# Patient Record
Sex: Female | Born: 1995 | Race: Black or African American | Hispanic: No | Marital: Married | Smoking: Current every day smoker
Health system: Southern US, Community
[De-identification: ages and names within clinical notes are randomized; demographics above are authoritative.]

## PROBLEM LIST (undated history)

## (undated) ENCOUNTER — Inpatient Hospital Stay (HOSPITAL_COMMUNITY): Payer: Self-pay

## (undated) DIAGNOSIS — Q613 Polycystic kidney, unspecified: Secondary | ICD-10-CM

## (undated) DIAGNOSIS — I10 Essential (primary) hypertension: Secondary | ICD-10-CM

## (undated) DIAGNOSIS — J45909 Unspecified asthma, uncomplicated: Secondary | ICD-10-CM

## (undated) HISTORY — PX: OTHER SURGICAL HISTORY: SHX169

---
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
## 2015-02-22 ENCOUNTER — Encounter (HOSPITAL_COMMUNITY): Payer: Self-pay | Admitting: *Deleted

## 2015-02-22 ENCOUNTER — Emergency Department (INDEPENDENT_AMBULATORY_CARE_PROVIDER_SITE_OTHER)
Admission: EM | Admit: 2015-02-22 | Discharge: 2015-02-22 | Disposition: A | Discharge status: Home / Self Care | Payer: Medicaid Other | Source: Home / Self Care | Attending: Family Medicine | Admitting: Family Medicine

## 2015-02-22 DIAGNOSIS — R197 Diarrhea, unspecified: Secondary | ICD-10-CM

## 2015-02-22 DIAGNOSIS — J069 Acute upper respiratory infection, unspecified: Secondary | ICD-10-CM

## 2015-02-22 DIAGNOSIS — J029 Acute pharyngitis, unspecified: Secondary | ICD-10-CM

## 2015-02-22 LAB — POCT URINALYSIS DIP (DEVICE)
Bilirubin Urine: NEGATIVE
Glucose, UA: NEGATIVE mg/dL
KETONES UR: NEGATIVE mg/dL
Leukocytes, UA: NEGATIVE
Nitrite: NEGATIVE
PROTEIN: NEGATIVE mg/dL
Urobilinogen, UA: 0.2 mg/dL (ref 0.0–1.0)
pH: 6.5 (ref 5.0–8.0)

## 2015-02-22 LAB — POCT PREGNANCY, URINE: Preg Test, Ur: NEGATIVE

## 2015-02-22 LAB — POCT RAPID STREP A: STREPTOCOCCUS, GROUP A SCREEN (DIRECT): NEGATIVE

## 2015-02-22 MED ORDER — LOPERAMIDE HCL 2 MG PO CAPS: 2.0000 mg | ORAL_CAPSULE | Freq: Four times a day (QID) | ORAL | Status: DC | PRN

## 2015-02-22 MED ORDER — IBUPROFEN 800 MG PO TABS: 800.0000 mg | ORAL_TABLET | Freq: Three times a day (TID) | ORAL | Status: DC

## 2015-02-22 NOTE — ED Provider Notes (Signed)
CSN: [id number]     Arrival date & time 02/22/15  1209 History   First MD Initiated Contact with Patient 02/22/15 1405     Chief Complaint  Patient presents with  . URI   (Consider location/radiation/quality/duration/timing/severity/associated sxs/prior Treatment) HPI    19 year old female presents complaining of sore throat, headache, subjective fever and chills, diarrhea. Her symptoms started on Monday and have been waxing and waning since that time. She feels like overall she is getting slightly better. She is taking over-the-counter medications with some temporary relief of her symptoms. She had one episode of vomiting on Monday but none since then. No chest pain or shortness of breath. She does have some mild lower abdominal pain. [place] travel out of the country. She got her flu shot this year. She has exposure to multiple other people with a similar illness  History reviewed. No pertinent past medical history. History reviewed. No pertinent past surgical history. History reviewed. No pertinent family history. History  Substance Use Topics  . Smoking status: Never Smoker   . Smokeless tobacco: Not on file  . Alcohol Use: No   OB History    No data available     Review of Systems  Constitutional: Positive for fever and chills.  HENT: Positive for congestion, rhinorrhea and sore throat.   Respiratory: Negative for cough and shortness of breath.   Cardiovascular: Negative for chest pain.  Gastrointestinal: Positive for abdominal pain and diarrhea.  All other systems reviewed and are negative.   Allergies  Review of patient's allergies indicates no known allergies.  Home Medications   Prior to Admission medications   Medication Sig Start Date End Date Taking? Authorizing Provider  clindamycin (CLEOCIN) 300 MG capsule Take 1 capsule (300 mg total) by mouth 3 (three) times daily. 07/03/14   Graylon GoodZachary H Derrico Zhong, PA-C  HYDROcodone-acetaminophen  (NORCO/VICODIN) 5-325 MG per tablet Take 1 tablet by mouth every 4 (four) hours as needed for moderate pain. 06/13/14   Burgess AmorJulie Idol, PA-C  ibuprofen (ADVIL,MOTRIN) 400 MG tablet Take 400 mg by mouth every 6 (six) hours as needed for moderate pain.    Historical Provider, MD  ibuprofen (ADVIL,MOTRIN) 800 MG tablet Take 1 tablet (800 mg total) by mouth 3 (three) times daily. 07/03/14   Graylon GoodZachary H Amilia Vandenbrink, PA-C  ibuprofen (ADVIL,MOTRIN) 800 MG tablet Take 1 tablet (800 mg total) by mouth 3 (three) times daily. 02/22/15   Graylon GoodZachary H Eston Heslin, PA-C  loperamide (IMODIUM) 2 MG capsule Take 1 capsule (2 mg total) by mouth 4 (four) times daily as needed for diarrhea or loose stools. 02/22/15   Adrian BlackwaterZachary H Kirsi Hugh, PA-C   BP 115/65 mmHg  Pulse 59  Temp(Src) 99.1 F (37.3 C) (Oral)  Resp 14  SpO2 100%  LMP 01/26/2015 Physical Exam  Constitutional: She is oriented to person, place, and time. Vital signs are normal. She appears well-developed and well-nourished. No distress.  HENT:  Head: Normocephalic and atraumatic.  Right Ear: External ear normal.  Left Ear: External ear normal.  Mouth/Throat: Posterior oropharyngeal erythema present. No oropharyngeal exudate.  Eyes: Conjunctivae are normal.  Neck: Normal range of motion. Neck supple.  Cardiovascular: Normal rate, regular rhythm and normal heart sounds.   Pulmonary/Chest: Effort normal and breath sounds normal. No respiratory distress.  Abdominal: Normal appearance and bowel sounds are normal. There is tenderness in the left lower quadrant. There is no rigidity, no rebound, no guarding and no CVA tenderness.  Lymphadenopathy:    She  has no cervical adenopathy.  Neurological: She is alert and oriented to person, place, and time. She has normal strength. Coordination normal.  Skin: Skin is warm and dry. No rash noted. She is not diaphoretic.  Psychiatric: She has a normal mood and affect. Judgment normal.  Nursing note and vitals reviewed.   ED Course   Procedures (including critical care time) Labs Review Labs Reviewed  POCT URINALYSIS DIP (DEVICE) - Abnormal; Notable for the following:    Hgb urine dipstick TRACE (*)    All other components within normal limits  POCT RAPID STREP A ([facility] CARE ONLY)  POCT PREGNANCY, URINE    Imaging Review No results found.   MDM   1. Pharyngitis   2. URI (upper respiratory infection)   3. Diarrhea    Rapid strep is negative. Minimal abdominal tenderness but the abdomen is soft without rigidity or guarding. Urinalysis is normal and urine pregnancy is negative. Most likely viral pharyngitis. Treat symptomatically with ibuprofen and Imodium. No recent antibiotic use to cause suspicion for C. difficile. Follow-up when necessary if no improvement in a few days  Meds ordered this encounter  Medications  . ibuprofen (ADVIL,MOTRIN) 800 MG tablet    Sig: Take 1 tablet (800 mg total) by mouth 3 (three) times daily.    Dispense:  30 tablet    Refill:  0  . loperamide (IMODIUM) 2 MG capsule    Sig: Take 1 capsule (2 mg total) by mouth 4 (four) times daily as needed for diarrhea or loose stools.    Dispense:  12 capsule    Refill:  0       Graylon Good, PA-C 02/22/15 1504

## 2015-02-22 NOTE — Discharge Instructions (Signed)
Food Choices to Help Relieve Diarrhea When you have diarrhea, the foods you eat and your eating habits are very important. Choosing the right foods and drinks can help relieve diarrhea. Also, because diarrhea can last up to 7 days, you need to replace lost fluids and electrolytes (such as sodium, potassium, and chloride) in order to help prevent dehydration.  WHAT GENERAL GUIDELINES DO I NEED TO FOLLOW?  Slowly drink 1 cup (8 oz) of fluid for each episode of diarrhea. If you are getting enough fluid, your urine will be clear or pale yellow.  Eat starchy foods. Some good choices include white rice, white toast, pasta, low-fiber cereal, baked potatoes (without the skin), saltine crackers, and bagels.  Avoid large servings of any cooked vegetables.  Limit fruit to two servings per day. A serving is  cup or 1 small piece.  Choose foods with less than 2 g of fiber per serving.  Limit fats to less than 8 tsp (38 g) per day.  Avoid fried foods.  Eat foods that have probiotics in them. Probiotics can be found in certain dairy products.  Avoid foods and beverages that may increase the speed at which food moves through the stomach and intestines (gastrointestinal tract). Things to avoid include:  High-fiber foods, such as dried fruit, raw fruits and vegetables, nuts, seeds, and whole grain foods.  Spicy foods and high-fat foods.  Foods and beverages sweetened with high-fructose corn syrup, honey, or sugar alcohols such as xylitol, sorbitol, and mannitol. WHAT FOODS ARE RECOMMENDED? Grains White rice. White, [place], or pita breads (fresh or toasted), including plain rolls, buns, or bagels. White pasta. Saltine, soda, or graham crackers. Pretzels. Low-fiber cereal. Cooked cereals made with water (such as cornmeal, farina, or cream cereals). Plain muffins. Matzo. Melba toast. Zwieback.  Vegetables Potatoes (without the skin). Strained tomato and vegetable juices. Most well-cooked and canned  vegetables without seeds. Tender lettuce. Fruits Cooked or canned applesauce, apricots, cherries, fruit cocktail, grapefruit, peaches, pears, or plums. Fresh bananas, apples without skin, cherries, grapes, cantaloupe, grapefruit, peaches, oranges, or plums.  Meat and Other Protein Products Baked or boiled chicken. Eggs. Tofu. Fish. Seafood. Smooth peanut butter. Ground or well-cooked tender beef, ham, veal, lamb, pork, or poultry.  Dairy Plain yogurt, kefir, and unsweetened liquid yogurt. Lactose-free milk, buttermilk, or soy milk. Plain hard cheese. Beverages Sport drinks. Clear broths. Diluted fruit juices (except prune). Regular, caffeine-free sodas such as ginger ale. Water. Decaffeinated teas. Oral rehydration solutions. Sugar-free beverages not sweetened with sugar alcohols. Other Bouillon, broth, or soups made from recommended foods.  The items listed above may not be a complete list of recommended foods or beverages. Contact your dietitian for more options. WHAT FOODS ARE NOT RECOMMENDED? Grains Whole grain, whole wheat, bran, or rye breads, rolls, pastas, crackers, and cereals. Wild or brown rice. Cereals that contain more than 2 g of fiber per serving. Corn tortillas or taco shells. Cooked or dry oatmeal. Granola. Popcorn. Vegetables Raw vegetables. Cabbage, broccoli, Brussels sprouts, artichokes, baked beans, beet greens, corn, kale, legumes, peas, sweet potatoes, and yams. Potato skins. Cooked spinach and cabbage. Fruits Dried fruit, including raisins and dates. Raw fruits. Stewed or dried prunes. Fresh apples with skin, apricots, mangoes, pears, raspberries, and strawberries.  Meat and Other Protein Products Chunky peanut butter. Nuts and seeds. Beans and lentils. Tomasa BlaseBacon.  Dairy High-fat cheeses. Milk, chocolate milk, and beverages made with milk, such as milk shakes. Cream. Ice cream. Sweets and Desserts Sweet rolls, doughnuts, and sweet breads. Pancakes  and waffles. Fats and  Oils Butter. Cream sauces. Margarine. Salad oils. Plain salad dressings. Olives. Avocados.  Beverages Caffeinated beverages (such as coffee, tea, soda, or energy drinks). Alcoholic beverages. Fruit juices with pulp. Prune juice. Soft drinks sweetened with high-fructose corn syrup or sugar alcohols. Other Coconut. Hot sauce. Chili powder. Mayonnaise. Gravy. Cream-based or milk-based soups.  The items listed above may not be a complete list of foods and beverages to avoid. Contact your dietitian for more information. WHAT SHOULD I DO IF I BECOME DEHYDRATED? Diarrhea can sometimes lead to dehydration. Signs of dehydration include dark urine and dry mouth and skin. If you think you are dehydrated, you should rehydrate with an oral rehydration solution. These solutions can be purchased at pharmacies, retail stores, or online.  Drink -1 cup (120-240 mL) of oral rehydration solution each time you have an episode of diarrhea. If drinking this amount makes your diarrhea worse, try drinking smaller amounts more often. For example, drink 1-3 tsp (5-15 mL) every 5-10 minutes.  A general rule for staying hydrated is to drink 1-2 L of fluid per day. Talk to your health care provider about the specific amount you should be drinking each day. Drink enough fluids to keep your urine clear or pale yellow. Document Released: 02/13/2004 Document Revised: 11/28/2013 Document Reviewed: 10/16/2013 [facility] Patient Information 2015 [place], [place]. This information is not intended to replace advice given to you by your health care provider. Make sure you discuss any questions you have with your health care provider.  Pharyngitis Pharyngitis is redness, pain, and swelling (inflammation) of your pharynx.  CAUSES  Pharyngitis is usually caused by infection. Most of the time, these infections are from viruses (viral) and are part of a cold. However, sometimes pharyngitis is caused by bacteria (bacterial). Pharyngitis can also  be caused by allergies. Viral pharyngitis may be spread from person to person by coughing, sneezing, and personal items or utensils (cups, forks, spoons, toothbrushes). Bacterial pharyngitis may be spread from person to person by more intimate contact, such as kissing.  SIGNS AND SYMPTOMS  Symptoms of pharyngitis include:   Sore throat.   Tiredness (fatigue).   Low-grade fever.   Headache.  Joint pain and muscle aches.  Skin rashes.  Swollen lymph nodes.  Plaque-like film on throat or tonsils (often seen with bacterial pharyngitis). DIAGNOSIS  Your health care provider will ask you questions about your illness and your symptoms. Your medical history, along with a physical exam, is often all that is needed to diagnose pharyngitis. Sometimes, a rapid strep test is done. Other lab tests may also be done, depending on the suspected cause.  TREATMENT  Viral pharyngitis will usually get better in 3-4 days without the use of medicine. Bacterial pharyngitis is treated with medicines that kill germs (antibiotics).  HOME CARE INSTRUCTIONS   Drink enough water and fluids to keep your urine clear or pale yellow.   Only take over-the-counter or prescription medicines as directed by your health care provider:   If you are prescribed antibiotics, make sure you finish them even if you start to feel better.   Do not take aspirin.   Get lots of rest.   Gargle with 8 oz of salt water ( tsp of salt per 1 qt of water) as often as every 1-2 hours to soothe your throat.   Throat lozenges (if you are not at risk for choking) or sprays may be used to soothe your throat. SEEK MEDICAL CARE IF:   You  have large, tender lumps in your neck.  You have a rash.  You cough up green, yellow-brown, or bloody spit. SEEK IMMEDIATE MEDICAL CARE IF:   Your neck becomes stiff.  You drool or are unable to swallow liquids.  You vomit or are unable to keep medicines or liquids down.  You have  severe pain that does not go away with the use of recommended medicines.  You have trouble breathing (not caused by a stuffy nose). MAKE SURE YOU:   Understand these instructions.  Will watch your condition.  Will get help right away if you are not doing well or get worse. Document Released: 11/23/2005 Document Revised: 09/13/2013 Document Reviewed: 07/31/2013 [facility] Patient Information 2015 [place], [place]. This information is not intended to replace advice given to you by your health care provider. Make sure you discuss any questions you have with your health care provider.

## 2015-02-22 NOTE — ED Notes (Signed)
Pt  Reports         sorethroat               Headache    Sweating             Slight  Cough         Slight      Diarrhea

## 2015-02-26 LAB — CULTURE, GROUP A STREP: STREP A CULTURE: NEGATIVE

## 2015-04-05 ENCOUNTER — Emergency Department (HOSPITAL_COMMUNITY)
Admission: EM | Admit: 2015-04-05 | Discharge: 2015-04-05 | Disposition: A | Discharge status: Home / Self Care | Payer: Medicaid Other | Attending: Emergency Medicine | Admitting: Emergency Medicine

## 2015-04-05 ENCOUNTER — Encounter (HOSPITAL_COMMUNITY): Payer: Self-pay

## 2015-04-05 DIAGNOSIS — R197 Diarrhea, unspecified: Secondary | ICD-10-CM | POA: Insufficient documentation

## 2015-04-05 DIAGNOSIS — Z79899 Other long term (current) drug therapy: Secondary | ICD-10-CM | POA: Diagnosis not present

## 2015-04-05 DIAGNOSIS — Z792 Long term (current) use of antibiotics: Secondary | ICD-10-CM | POA: Diagnosis not present

## 2015-04-05 DIAGNOSIS — Z791 Long term (current) use of non-steroidal anti-inflammatories (NSAID): Secondary | ICD-10-CM | POA: Diagnosis not present

## 2015-04-05 DIAGNOSIS — R61 Generalized hyperhidrosis: Secondary | ICD-10-CM | POA: Diagnosis not present

## 2015-04-05 DIAGNOSIS — J069 Acute upper respiratory infection, unspecified: Secondary | ICD-10-CM | POA: Insufficient documentation

## 2015-04-05 DIAGNOSIS — R05 Cough: Secondary | ICD-10-CM | POA: Diagnosis present

## 2015-04-05 MED ORDER — IBUPROFEN 800 MG PO TABS: 800.0000 mg | ORAL_TABLET | Freq: Three times a day (TID) | ORAL | Status: DC | PRN

## 2015-04-05 MED ORDER — OXYMETAZOLINE HCL 0.05 % NA SOLN: 1.0000 | Freq: Two times a day (BID) | NASAL | Status: DC

## 2015-04-05 MED ORDER — ALBUTEROL SULFATE HFA 108 (90 BASE) MCG/ACT IN AERS
2.0000 | INHALATION_SPRAY | Freq: Once | RESPIRATORY_TRACT | Status: AC
  Administered 2015-04-05: 2 via RESPIRATORY_TRACT
  Filled 2015-04-05: qty 6.7

## 2015-04-05 MED ORDER — BENZONATATE 100 MG PO CAPS: 100.0000 mg | ORAL_CAPSULE | Freq: Three times a day (TID) | ORAL | Status: DC | PRN

## 2015-04-05 NOTE — ED Notes (Signed)
Chest pain with cough, sob for over 1 week. Periods of sweating. Pt. Having nausea and vomited X1.  Skin is warm and Dry. GCS 15

## 2015-04-05 NOTE — Discharge Instructions (Signed)
Read the information below.  Use the prescribed medication as directed.  Please discuss all new medications with your pharmacist.  You may return to the Emergency Department at any time for worsening condition or any new symptoms that concern you.    If there is any possibility that you might be pregnant, please let your health care provider know and discuss this with the pharmacist to ensure medication safety.  If you develop high fevers that do not resolve with tylenol or ibuprofen, you have difficulty swallowing or breathing, or you are unable to tolerate fluids by mouth, return to the ER for a recheck.      Upper Respiratory Infection, Adult An upper respiratory infection (URI) is also known as the common cold. It is often caused by a type of germ (virus). Colds are easily spread (contagious). You can pass it to others by kissing, coughing, sneezing, or drinking out of the same glass. Usually, you get better in 1 or 2 weeks.  HOME CARE   Only take medicine as told by your doctor.  Use a warm mist humidifier or breathe in steam from a hot shower.  Drink enough water and fluids to keep your pee (urine) clear or pale yellow.  Get plenty of rest.  Return to work when your temperature is back to normal or as told by your doctor. You may use a face mask and wash your hands to stop your cold from spreading. GET HELP RIGHT AWAY IF:   After the first few days, you feel you are getting worse.  You have questions about your medicine.  You have chills, shortness of breath, or brown or red spit (mucus).  You have yellow or brown snot (nasal discharge) or pain in the face, especially when you bend forward.  You have a fever, puffy (swollen) neck, pain when you swallow, or white spots in the back of your throat.  You have a bad headache, ear pain, sinus pain, or chest pain.  You have a high-pitched whistling sound when you breathe in and out (wheezing).  You have a lasting cough or cough up  blood.  You have sore muscles or a stiff neck. MAKE SURE YOU:   Understand these instructions.  Will watch your condition.  Will get help right away if you are not doing well or get worse. Document Released: 05/11/2008 Document Revised: 02/15/2012 Document Reviewed: 02/28/2014 [facility] Patient Information 2015 [place], [place]. This information is not intended to replace advice given to you by your health care provider. Make sure you discuss any questions you have with your health care provider.

## 2015-04-05 NOTE — ED Provider Notes (Signed)
CSN: [id number]     Arrival date & time 04/05/15  1616 History  This chart was scribed for non-physician practitioner, Trixie DredgeEmily Loda Bialas, PA-C, working with Geoffery Lyonsouglas Delo, MD, by Bronson CurbJacqueline Melvin, ED Scribe. This patient was seen in room TR07C/TR07C and the patient's care was started at 4:57 PM.   Chief Complaint  Patient presents with  . Cough    The history is provided by the patient. No language interpreter was used.     HPI Comments: Samantha Rojas is a 19 y.o. female, with no significant past medical history, who presents to the Emergency Department complaining of intermittent cough for the past week. There is associated congestion and intermittent sweating at night. She reports nasal congestion and notes mild nose bleeds from a cut in her nose last week. She also mentions diarrhea, stating she has been having more frequent BMs since onset of symptoms. Patient states she is unable to breathe through nose while sleeping, but notes relief upon application of Vick's vapor rub.  She denies pain in her chest, notes "rattling" in her chest with breathing. She denies nausea, vomiting, fever, chills, sore throat, itchy/watery eyes. She mentions mild abdominal cramping, but states this is related to her menstrual period. Patient has not received the flu vaccination this season.   History reviewed. No pertinent past medical history. History reviewed. No pertinent past surgical history. No family history on file. History  Substance Use Topics  . Smoking status: Never Smoker   . Smokeless tobacco: Not on file  . Alcohol Use: No   OB History    No data available     Review of Systems  Constitutional: Positive for diaphoresis (night sweats). Negative for fever and chills.  HENT: Positive for congestion. Negative for sore throat.   Eyes: Negative for discharge and itching.  Respiratory: Positive for cough and shortness of breath.   Gastrointestinal: Positive for diarrhea. Negative for nausea and  vomiting.  Allergic/Immunologic: Positive for environmental allergies. Negative for immunocompromised state.  All other systems reviewed and are negative.     Allergies  Review of patient's allergies indicates no known allergies.  Home Medications   Prior to Admission medications   Medication Sig Start Date End Date Taking? Authorizing Provider  clindamycin (CLEOCIN) 300 MG capsule Take 1 capsule (300 mg total) by mouth 3 (three) times daily. 07/03/14   Graylon GoodZachary H Baker, PA-C  HYDROcodone-acetaminophen (NORCO/VICODIN) 5-325 MG per tablet Take 1 tablet by mouth every 4 (four) hours as needed for moderate pain. 06/13/14   Burgess AmorJulie Idol, PA-C  ibuprofen (ADVIL,MOTRIN) 400 MG tablet Take 400 mg by mouth every 6 (six) hours as needed for moderate pain.    Historical Provider, MD  ibuprofen (ADVIL,MOTRIN) 800 MG tablet Take 1 tablet (800 mg total) by mouth 3 (three) times daily. 07/03/14   Graylon GoodZachary H Baker, PA-C  ibuprofen (ADVIL,MOTRIN) 800 MG tablet Take 1 tablet (800 mg total) by mouth 3 (three) times daily. 02/22/15   Graylon GoodZachary H Baker, PA-C  loperamide (IMODIUM) 2 MG capsule Take 1 capsule (2 mg total) by mouth 4 (four) times daily as needed for diarrhea or loose stools. 02/22/15   Graylon GoodZachary H Baker, PA-C   Triage Vitals: BP 119/75 mmHg  Pulse 61  Temp(Src) 98 F (36.7 C)  Resp 18  Ht 5' 9.75" (1.772 m)  Wt 142 lb 7 oz (64.609 kg)  BMI 20.58 kg/m2  SpO2 100%  LMP 04/04/2015  Physical Exam  Constitutional: She appears well-developed and well-nourished. No distress.  HENT:  Head: Normocephalic and atraumatic.  Mouth/Throat: Posterior oropharyngeal erythema present. No oropharyngeal exudate or posterior oropharyngeal edema.  Erythema in throat without edema or exudate.  Nasal turbinates are edematous and erythematous.  Eyes: Conjunctivae are normal.  Neck: Neck supple.  Cardiovascular: Normal rate and regular rhythm.   Pulmonary/Chest: Effort normal and breath sounds normal. No respiratory  distress. She has no wheezes. She has no rales.  Lungs are clear.  Neurological: She is alert.  Skin: She is not diaphoretic.  Nursing note and vitals reviewed.   ED Course  Procedures (including critical care time)  DIAGNOSTIC STUDIES: Oxygen Saturation is 100% on room air, normal by my interpretation.    COORDINATION OF CARE: At 1706 Discussed treatment plan with patient which includes albuterol inhaler. Patient agrees.   Labs Review Labs Reviewed - No data to display  Imaging Review No results found.   EKG Interpretation None        Date: 04/05/2015  Rate: 60 Rhythm: normal sinus rhythm  QRS Axis: normal  Intervals: normal  ST/T Wave abnormalities: normal  Conduction Disutrbances: none  Narrative Interpretation:   Old EKG Reviewed: unavailable     MDM   Final diagnoses:  URI (upper respiratory infection)    Afebrile, nontoxic patient with constellation of symptoms suggestive of viral syndrome.  No concerning findings on exam.  Discharged home with supportive care, PCP follow up.  Discussed result, findings, treatment, and follow up  with patient.  Pt given return precautions.  Pt verbalizes understanding and agrees with plan.      I personally performed the services described in this documentation, which was scribed in my presence. The recorded information has been reviewed and is accurate.    Trixie Dredge, PA-C 04/05/15 1931  Geoffery Lyons, MD 04/05/15 2013

## 2015-05-17 ENCOUNTER — Emergency Department (HOSPITAL_COMMUNITY): Payer: Medicaid Other

## 2015-05-17 ENCOUNTER — Encounter (HOSPITAL_COMMUNITY): Payer: Self-pay | Admitting: Emergency Medicine

## 2015-05-17 ENCOUNTER — Emergency Department (HOSPITAL_COMMUNITY)
Admission: EM | Admit: 2015-05-17 | Discharge: 2015-05-17 | Disposition: A | Discharge status: Home / Self Care | Payer: Medicaid Other | Attending: Emergency Medicine | Admitting: Emergency Medicine

## 2015-05-17 DIAGNOSIS — Y9389 Activity, other specified: Secondary | ICD-10-CM | POA: Diagnosis not present

## 2015-05-17 DIAGNOSIS — S299XXA Unspecified injury of thorax, initial encounter: Secondary | ICD-10-CM | POA: Diagnosis not present

## 2015-05-17 DIAGNOSIS — Y998 Other external cause status: Secondary | ICD-10-CM | POA: Insufficient documentation

## 2015-05-17 DIAGNOSIS — S0990XA Unspecified injury of head, initial encounter: Secondary | ICD-10-CM | POA: Diagnosis present

## 2015-05-17 DIAGNOSIS — J45909 Unspecified asthma, uncomplicated: Secondary | ICD-10-CM | POA: Diagnosis not present

## 2015-05-17 DIAGNOSIS — S3992XA Unspecified injury of lower back, initial encounter: Secondary | ICD-10-CM

## 2015-05-17 DIAGNOSIS — Y9289 Other specified places as the place of occurrence of the external cause: Secondary | ICD-10-CM | POA: Insufficient documentation

## 2015-05-17 DIAGNOSIS — W19XXXA Unspecified fall, initial encounter: Secondary | ICD-10-CM

## 2015-05-17 DIAGNOSIS — Z79899 Other long term (current) drug therapy: Secondary | ICD-10-CM | POA: Diagnosis not present

## 2015-05-17 HISTORY — DX: Unspecified asthma, uncomplicated: J45.909

## 2015-05-17 MED ORDER — ACETAMINOPHEN 500 MG PO TABS
1000.0000 mg | ORAL_TABLET | Freq: Once | ORAL | Status: AC
  Administered 2015-05-17: 1000 mg via ORAL
  Filled 2015-05-17: qty 2

## 2015-05-17 MED ORDER — IBUPROFEN 800 MG PO TABS: 800.0000 mg | ORAL_TABLET | Freq: Three times a day (TID) | ORAL | Status: DC

## 2015-05-17 MED ORDER — IBUPROFEN 800 MG PO TABS
800.0000 mg | ORAL_TABLET | Freq: Once | ORAL | Status: AC
  Administered 2015-05-17: 800 mg via ORAL
  Filled 2015-05-17: qty 1

## 2015-05-17 NOTE — ED Notes (Signed)
Patient transported to X-ray 
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
## 2015-05-17 NOTE — Discharge Instructions (Signed)
Return to the emergency room with worsening of symptoms, new symptoms or with symptoms that are concerning , especially worsening back pain, loss of control of bladder or bowel, numbness, tingling weakness OR severe worsening of headache, visual or speech changes, weakness in face, arms or legs. Follow up with wellness center in one week. RICE: Rest, Ice (three cycles of 20 mins on, off at least twice a day), compression/brace, elevation. Heating pad works well for back pain. Ibuprofen for pain. Read below information and follow recommendations.  Contusion A contusion is a deep bruise. Contusions are the result of an injury that caused bleeding under the skin. The contusion may turn blue, purple, or yellow. Minor injuries will give you a painless contusion, but more severe contusions may stay painful and swollen for a few weeks.  CAUSES  A contusion is usually caused by a blow, trauma, or direct force to an area of the body. SYMPTOMS   Swelling and redness of the injured area.  Bruising of the injured area.  Tenderness and soreness of the injured area.  Pain. DIAGNOSIS  The diagnosis can be made by taking a history and physical exam. An X-ray, CT scan, or MRI may be needed to determine if there were any associated injuries, such as fractures. TREATMENT  Specific treatment will depend on what area of the body was injured. In general, the best treatment for a contusion is resting, icing, elevating, and applying cold compresses to the injured area. Over-the-counter medicines may also be recommended for pain control. Ask your caregiver what the best treatment is for your contusion. HOME CARE INSTRUCTIONS   Put ice on the injured area.  Put ice in a plastic bag.  Place a towel between your skin and the bag.  Leave the ice on for 15-20 minutes, 3-4 times a day, or as directed by your health care provider.  Only take over-the-counter or prescription medicines for pain, discomfort, or  fever as directed by your caregiver. Your caregiver may recommend avoiding anti-inflammatory medicines (aspirin, ibuprofen, and naproxen) for 48 hours because these medicines may increase bruising.  Rest the injured area.  If possible, elevate the injured area to reduce swelling. SEEK IMMEDIATE MEDICAL CARE IF:   You have increased bruising or swelling.  You have pain that is getting worse.  Your swelling or pain is not relieved with medicines. MAKE SURE YOU:   Understand these instructions.  Will watch your condition.  Will get help right away if you are not doing well or get worse. Document Released: 09/02/2005 Document Revised: 11/28/2013 Document Reviewed: 09/28/2011 [facility] Patient Information 2015 [place], [place]. This information is not intended to replace advice given to you by your health care provider. Make sure you discuss any questions you have with your health care provider.
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
## 2015-09-10 IMAGING — CR DG LUMBAR SPINE COMPLETE 4+V
5 series · 5 of 5 positions shown · non-contrast
Comparison: 04/01/2013

CLINICAL DATA: Jumped from second floor window with back pain

EXAM:
LUMBAR SPINE - COMPLETE 4+ VIEW

[l-spine ap]
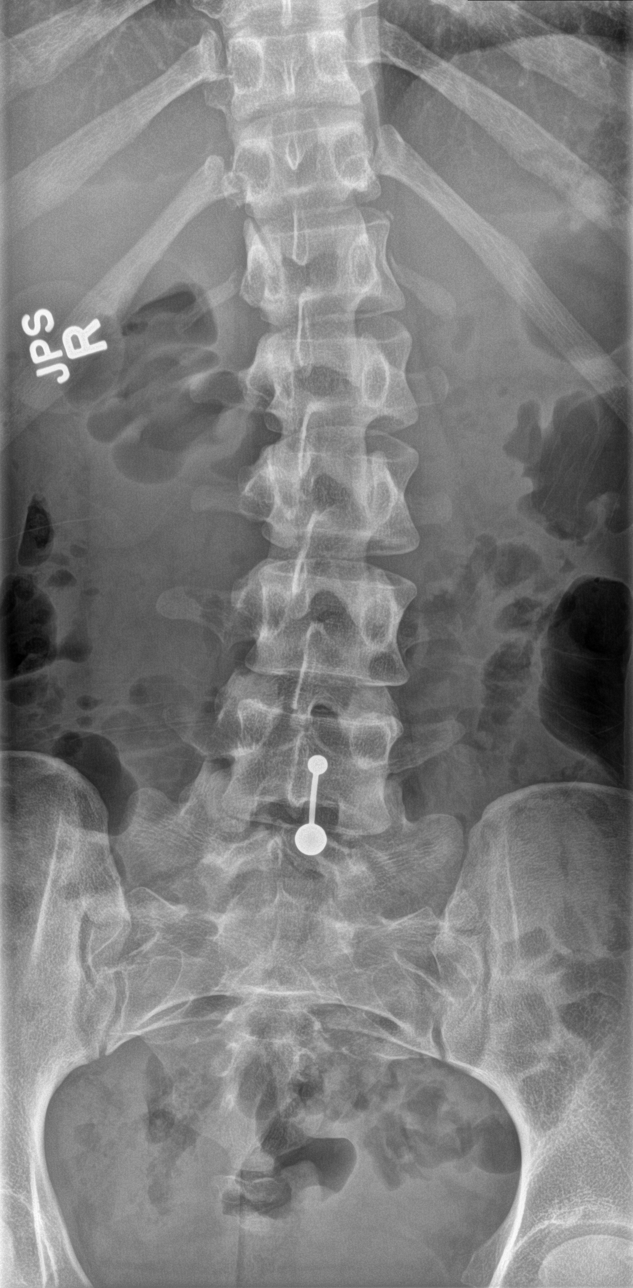

[l-spine obl (1 of 2)]
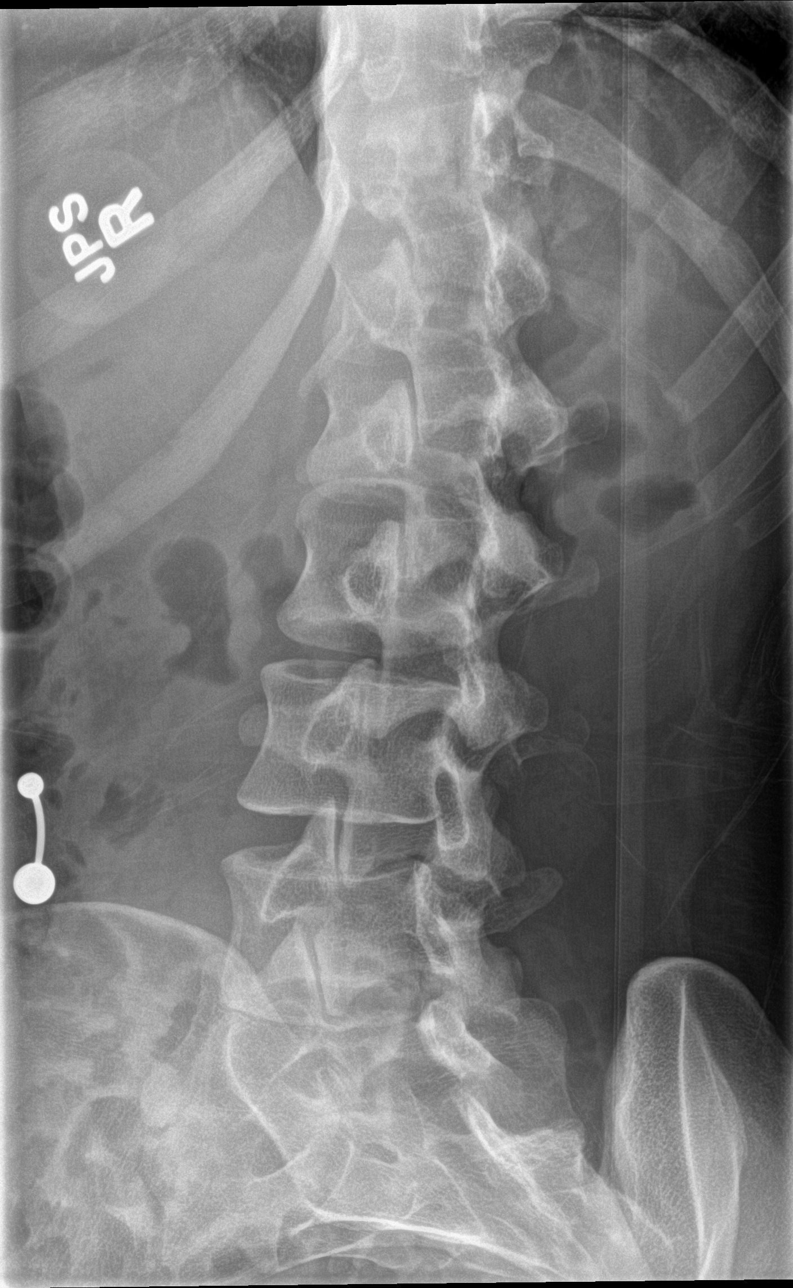

[l-spine obl (2 of 2)]
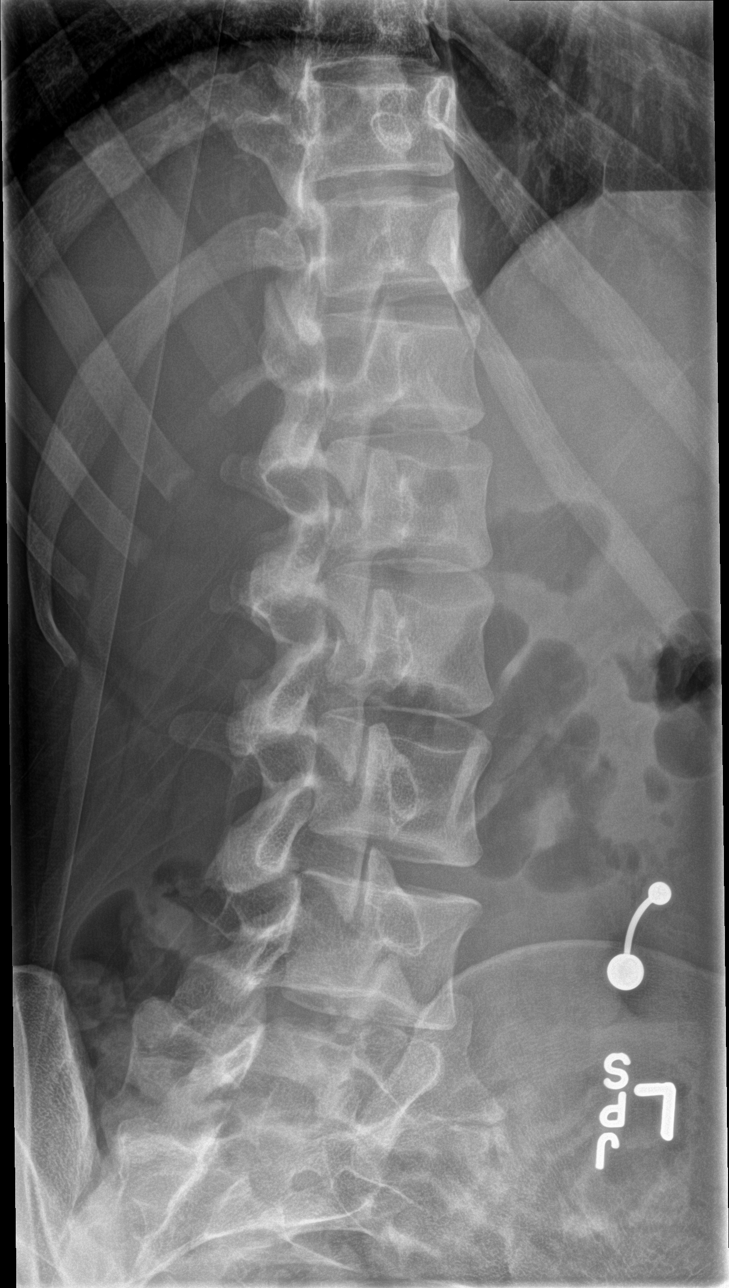

[l-spine lat]
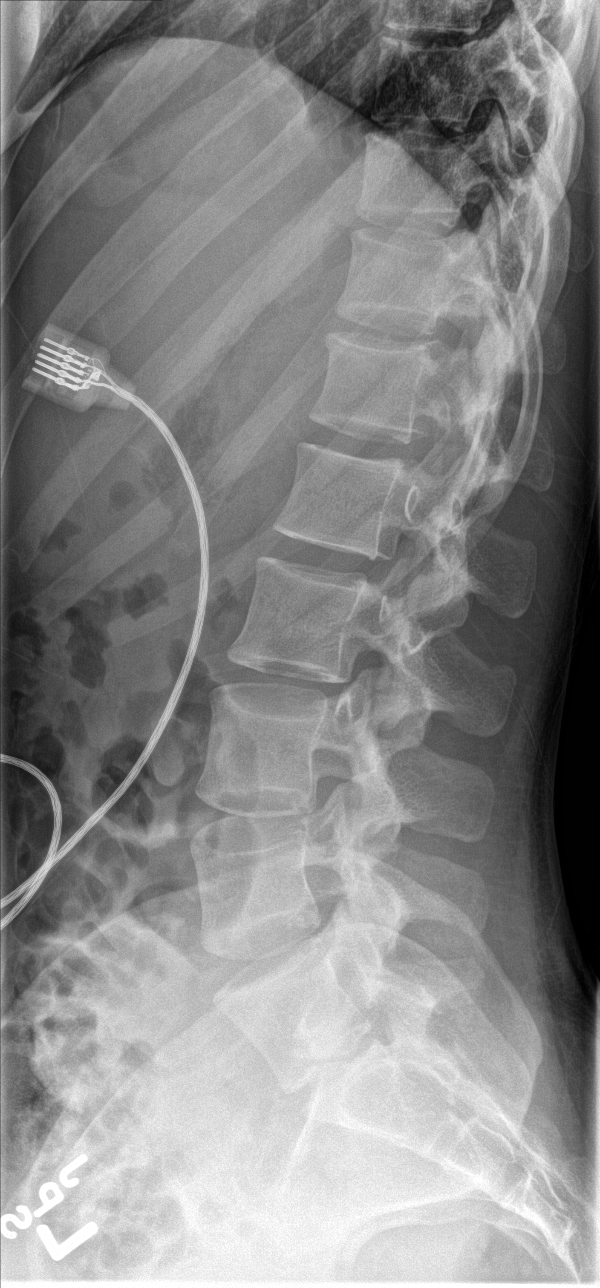

[l-spine spot]
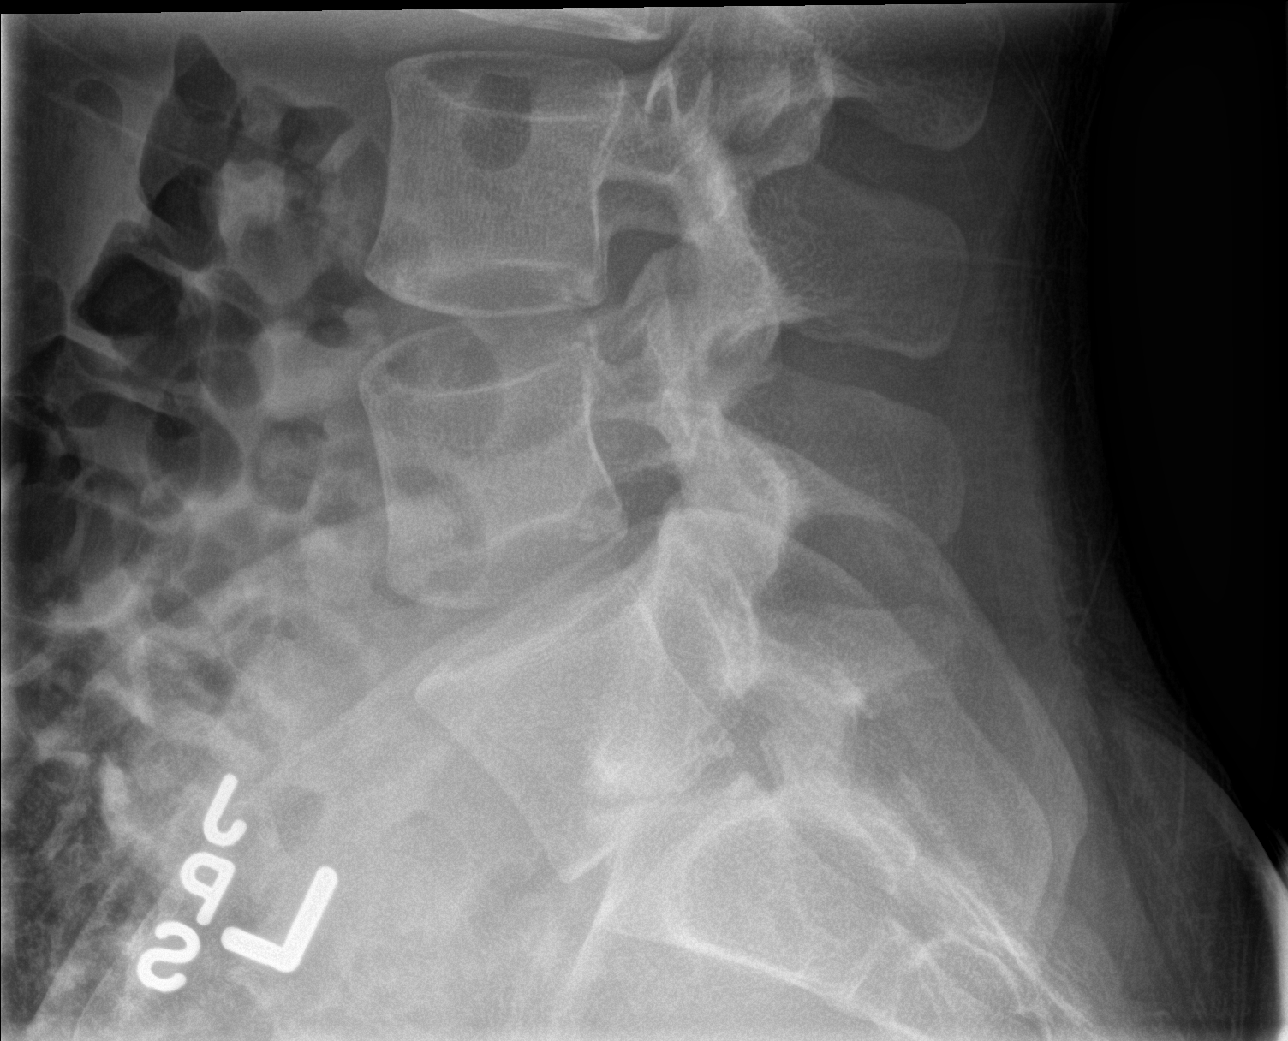

[5 of 5 positions shown; findings below may reference images not displayed]

FINDINGS: Vertebral body height is well maintained. The fifth lumbar vertebra
is partially sacralized. No pars defects are seen. No
anterolisthesis is noted. No acute abnormality is seen. Stable
scoliosis from the prior exam is noted.
IMPRESSION: Stable scoliosis.  No acute abnormality is noted.

## 2015-09-10 IMAGING — CR DG CHEST 2V
2 series · 2 of 2 positions shown · non-contrast
Comparison: None.

CLINICAL DATA: Jumped from second floor window with chest pain

EXAM:
CHEST - 2 VIEW

[chest pa]
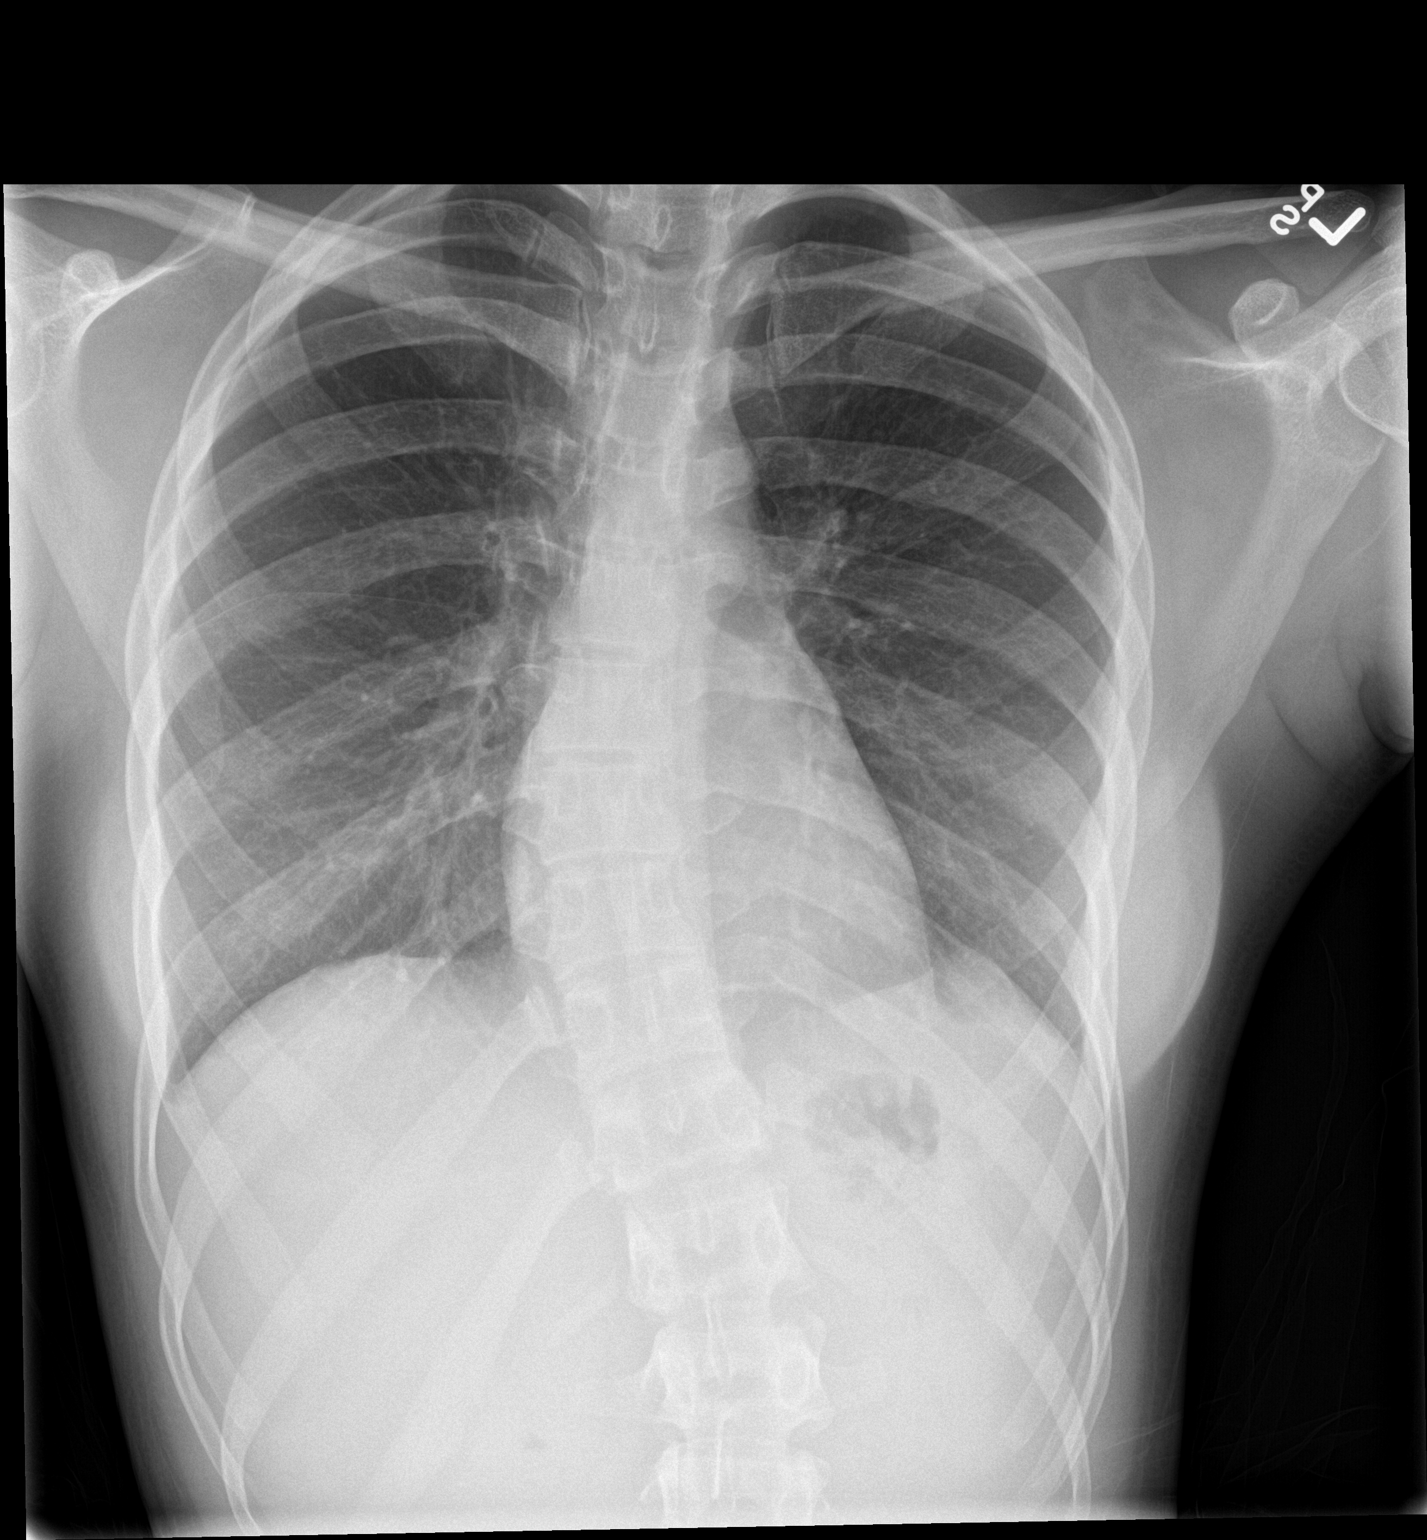

[chest lat]
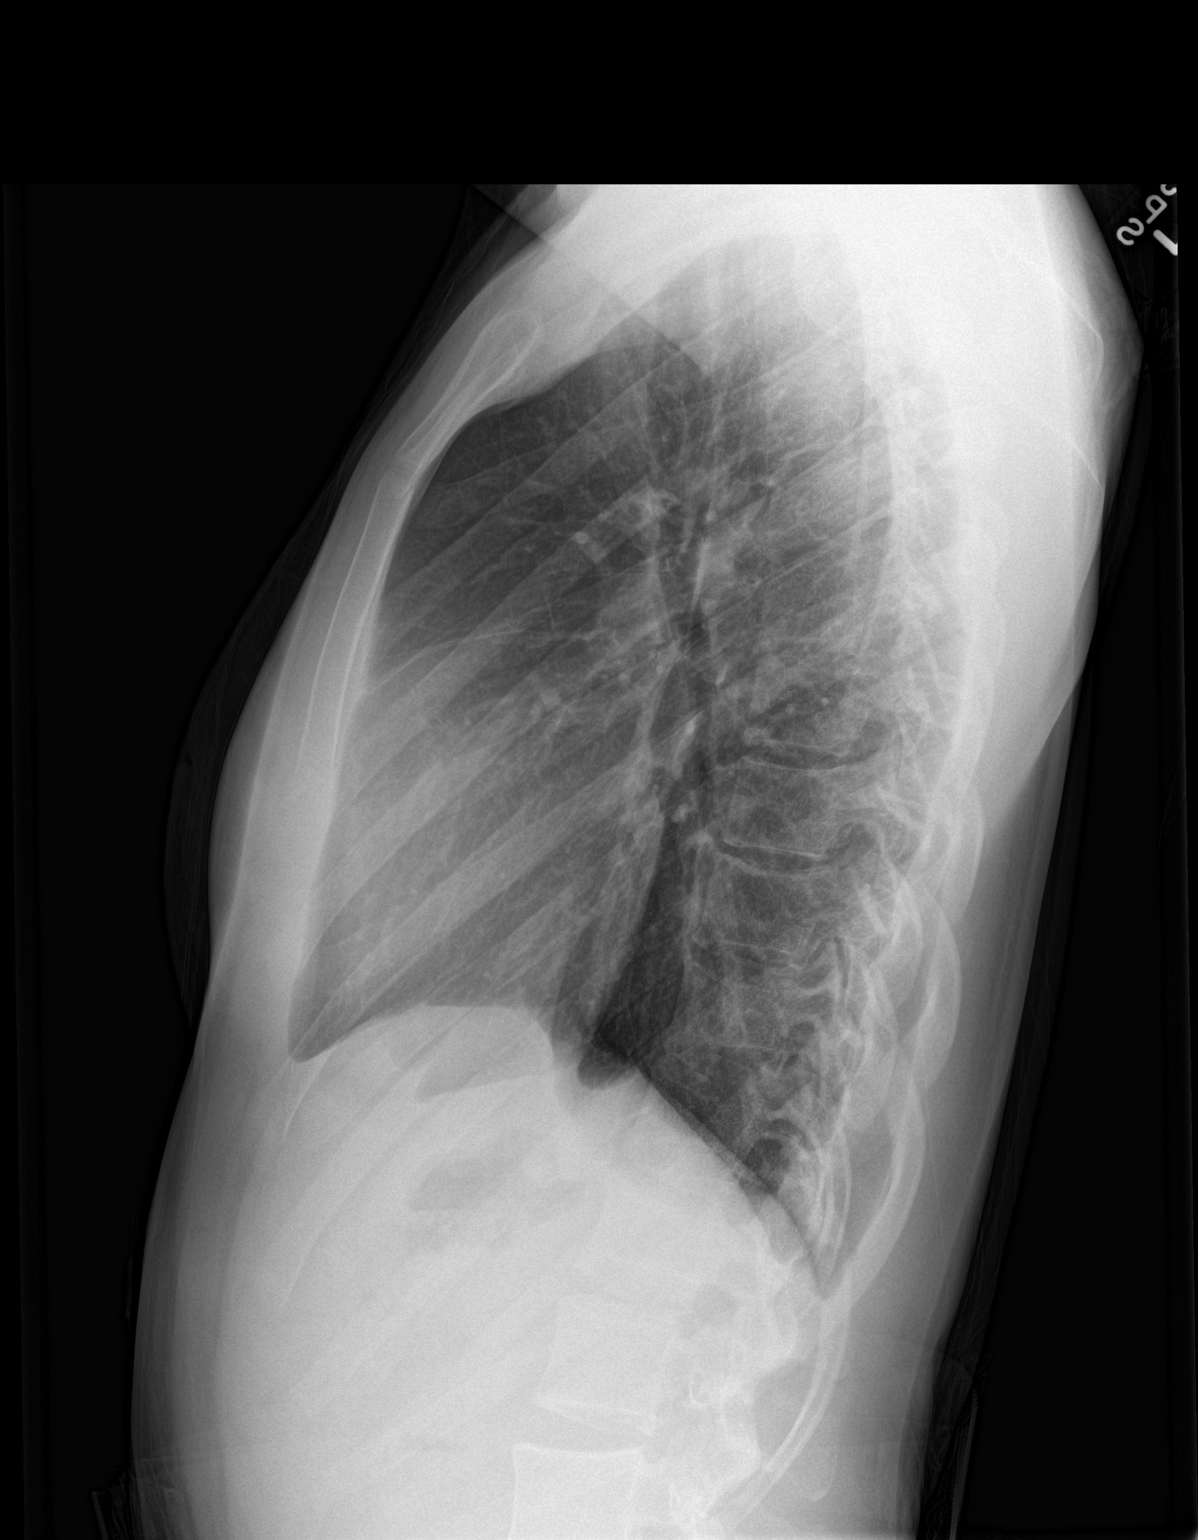

[2 of 2 positions shown; findings below may reference images not displayed]

FINDINGS: The heart size and mediastinal contours are within normal limits.
Both lungs are clear. The visualized skeletal structures show a
scoliosis of the thoracolumbar spine.
IMPRESSION: No active disease.

## 2015-09-10 IMAGING — CR DG THORACIC SPINE 2V
3 series · 3 of 3 positions shown · non-contrast
Comparison: None.

CLINICAL DATA: Jumped from second floor window with back pain

EXAM:
THORACIC SPINE - 2 VIEW

[t-spine ap]
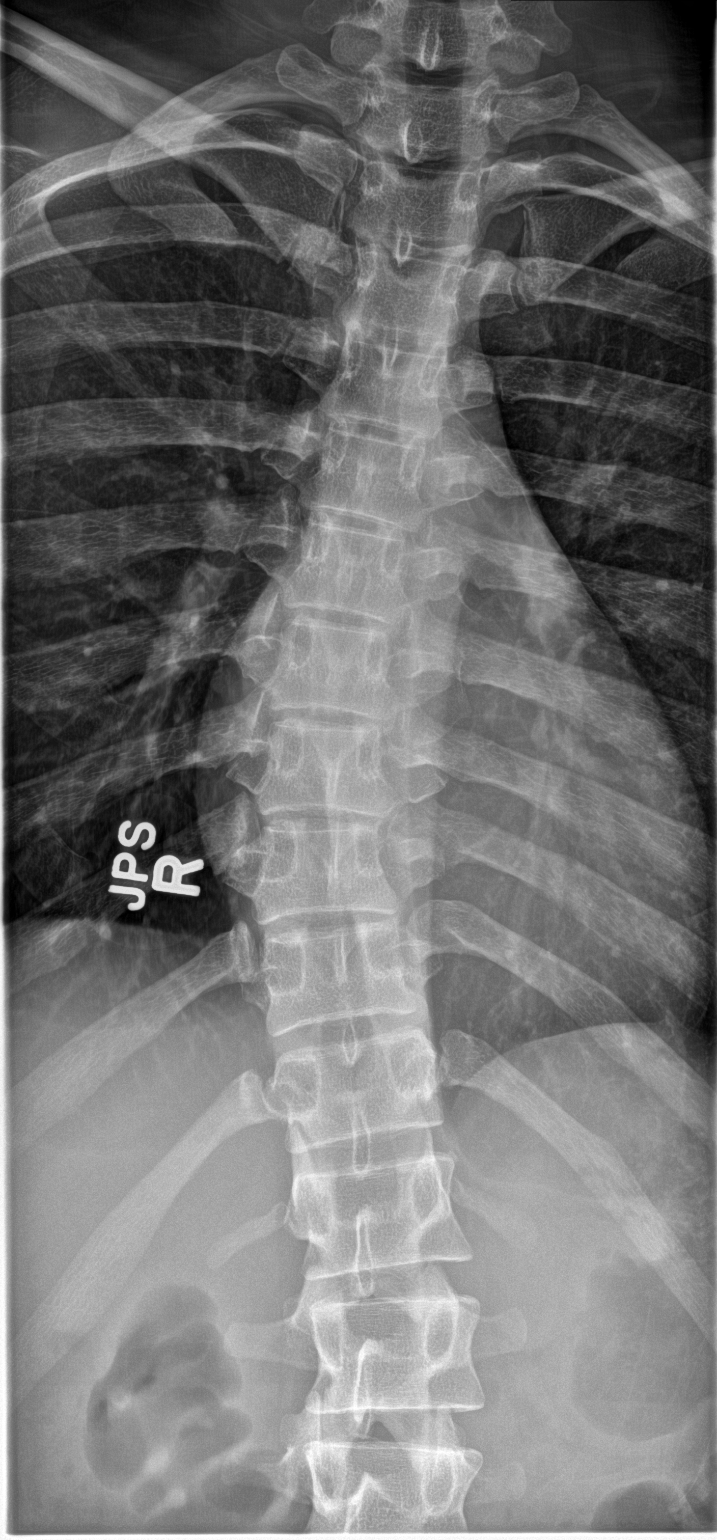

[t-spine lat]
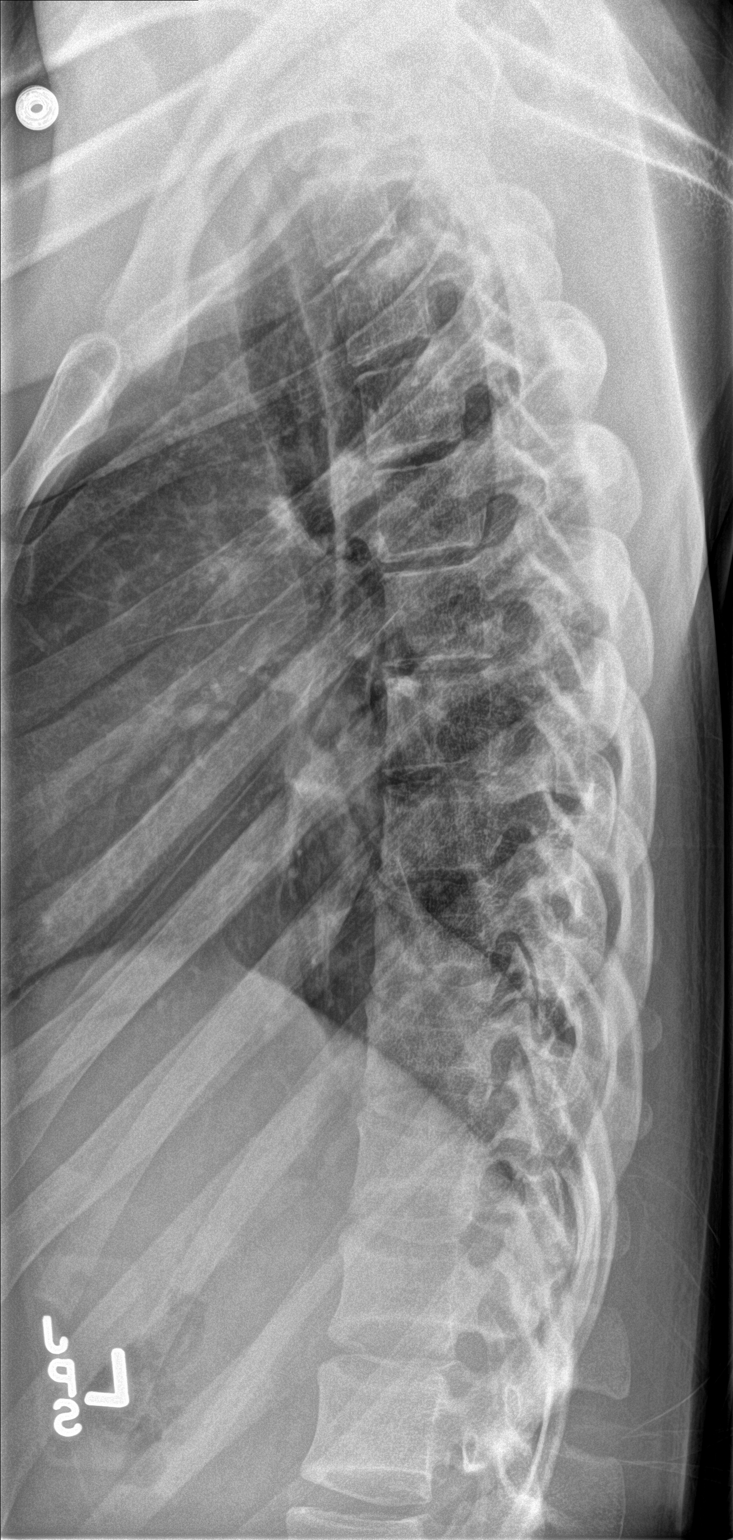

[t-spine swimmers]
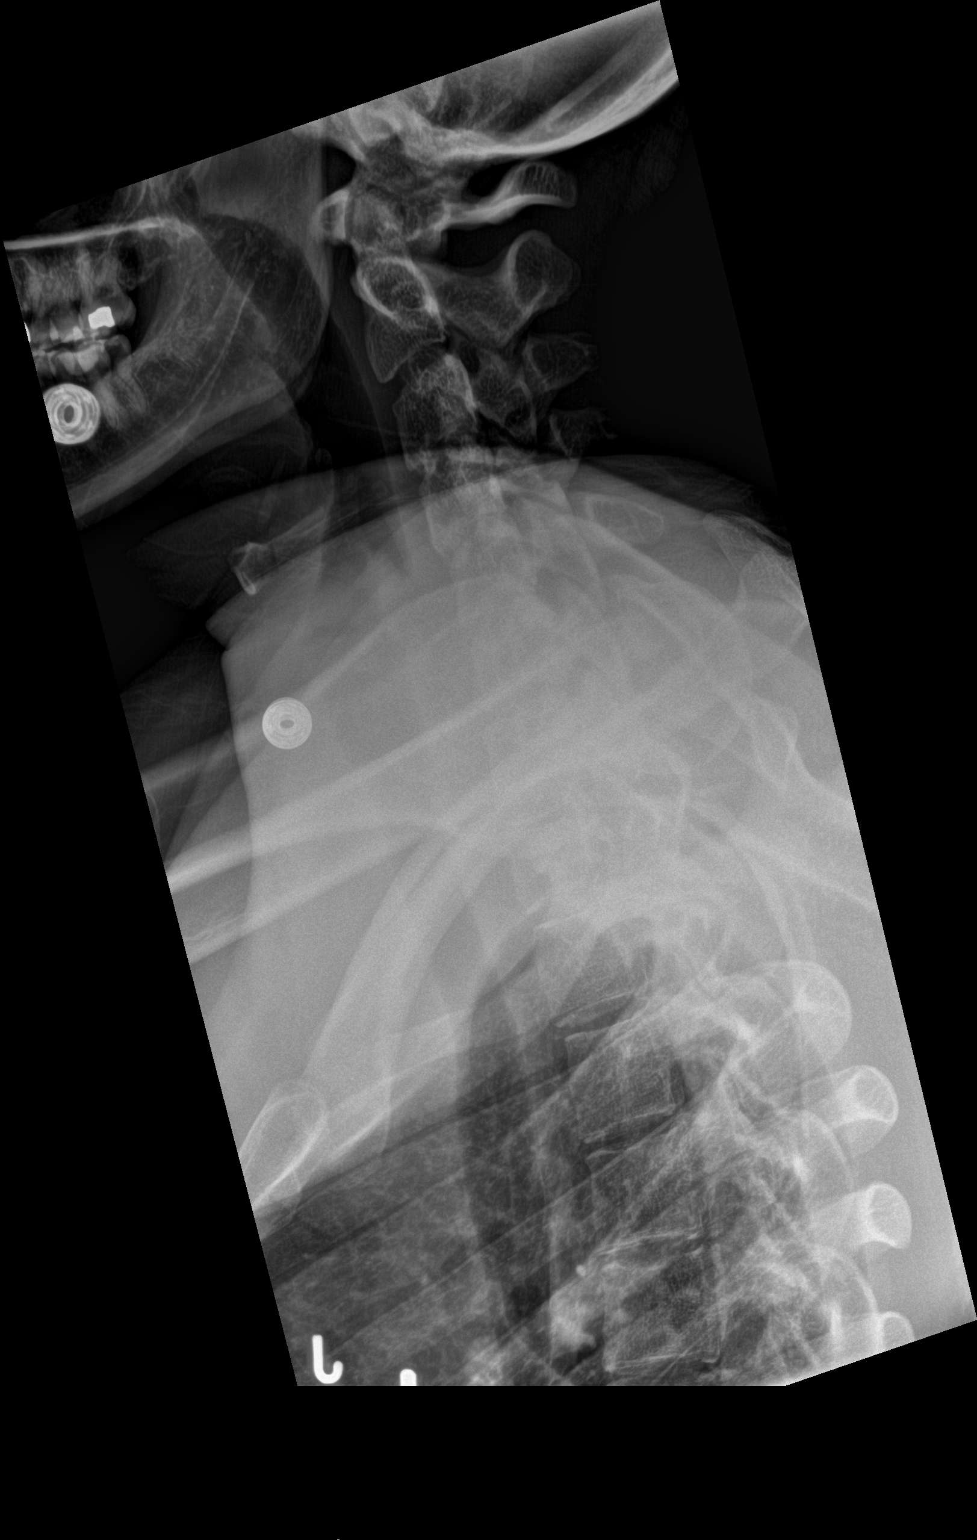

[3 of 3 positions shown; findings below may reference images not displayed]

FINDINGS: Scoliosis is noted concave to the left in the mid thoracic spine
centered at approximately the T8 level. No vertebral anomaly is
noted. No compression deformities are seen.
IMPRESSION: Scoliosis without acute abnormality.

## 2016-12-07 HISTORY — PX: DILATION AND CURETTAGE OF UTERUS: SHX78

## 2017-02-25 ENCOUNTER — Ambulatory Visit (HOSPITAL_COMMUNITY)
Admission: EM | Admit: 2017-02-25 | Discharge: 2017-02-25 | Disposition: A | Discharge status: Other Acute Inpatient Hospital | Payer: Medicaid Other

## 2017-02-25 ENCOUNTER — Emergency Department (HOSPITAL_COMMUNITY)
Admission: EM | Admit: 2017-02-25 | Discharge: 2017-02-25 | Disposition: A | Discharge status: Home / Self Care | Attending: Emergency Medicine | Admitting: Emergency Medicine

## 2017-02-25 ENCOUNTER — Emergency Department (HOSPITAL_COMMUNITY)

## 2017-02-25 ENCOUNTER — Encounter (HOSPITAL_COMMUNITY): Payer: Self-pay

## 2017-02-25 DIAGNOSIS — N760 Acute vaginitis: Secondary | ICD-10-CM

## 2017-02-25 DIAGNOSIS — O23591 Infection of other part of genital tract in pregnancy, first trimester: Secondary | ICD-10-CM | POA: Insufficient documentation

## 2017-02-25 DIAGNOSIS — Z3A01 Less than 8 weeks gestation of pregnancy: Secondary | ICD-10-CM | POA: Diagnosis not present

## 2017-02-25 DIAGNOSIS — H65192 Other acute nonsuppurative otitis media, left ear: Secondary | ICD-10-CM | POA: Diagnosis not present

## 2017-02-25 DIAGNOSIS — O26891 Other specified pregnancy related conditions, first trimester: Secondary | ICD-10-CM | POA: Diagnosis present

## 2017-02-25 DIAGNOSIS — H9212 Otorrhea, left ear: Secondary | ICD-10-CM | POA: Diagnosis not present

## 2017-02-25 DIAGNOSIS — O0281 Inappropriate change in quantitative human chorionic gonadotropin (hCG) in early pregnancy: Secondary | ICD-10-CM | POA: Diagnosis not present

## 2017-02-25 DIAGNOSIS — Z3491 Encounter for supervision of normal pregnancy, unspecified, first trimester: Secondary | ICD-10-CM

## 2017-02-25 DIAGNOSIS — Z79899 Other long term (current) drug therapy: Secondary | ICD-10-CM | POA: Diagnosis not present

## 2017-02-25 DIAGNOSIS — J45909 Unspecified asthma, uncomplicated: Secondary | ICD-10-CM | POA: Diagnosis not present

## 2017-02-25 DIAGNOSIS — B9689 Other specified bacterial agents as the cause of diseases classified elsewhere: Secondary | ICD-10-CM

## 2017-02-25 LAB — CBC WITH DIFFERENTIAL/PLATELET
BASOS ABS: 0 10*3/uL (ref 0.0–0.1)
Basophils Relative: 0 %
Eosinophils Absolute: 0 10*3/uL (ref 0.0–0.7)
Eosinophils Relative: 1 %
HEMATOCRIT: 35 % — AB (ref 36.0–46.0)
HEMOGLOBIN: 12 g/dL (ref 12.0–15.0)
Lymphocytes Relative: 37 %
Lymphs Abs: 1.3 10*3/uL (ref 0.7–4.0)
MCH: 29.4 pg (ref 26.0–34.0)
MCHC: 34.3 g/dL (ref 30.0–36.0)
MCV: 85.8 fL (ref 78.0–100.0)
Monocytes Absolute: 0.3 10*3/uL (ref 0.1–1.0)
Monocytes Relative: 9 %
NEUTROS ABS: 1.8 10*3/uL (ref 1.7–7.7)
NEUTROS PCT: 53 %
Platelets: 262 10*3/uL (ref 150–400)
RBC: 4.08 MIL/uL (ref 3.87–5.11)
RDW: 13.1 % (ref 11.5–15.5)
WBC: 3.5 10*3/uL — ABNORMAL LOW (ref 4.0–10.5)

## 2017-02-25 LAB — WET PREP, GENITAL
Sperm: NONE SEEN
Trich, Wet Prep: NONE SEEN
YEAST WET PREP: NONE SEEN

## 2017-02-25 LAB — COMPREHENSIVE METABOLIC PANEL
ALBUMIN: 4 g/dL (ref 3.5–5.0)
ALK PHOS: 31 U/L — AB (ref 38–126)
ALT: 16 U/L (ref 14–54)
AST: 15 U/L (ref 15–41)
Anion gap: 9 (ref 5–15)
BILIRUBIN TOTAL: 0.3 mg/dL (ref 0.3–1.2)
BUN: 9 mg/dL (ref 6–20)
CO2: 24 mmol/L (ref 22–32)
CREATININE: 0.56 mg/dL (ref 0.44–1.00)
Calcium: 9.7 mg/dL (ref 8.9–10.3)
Chloride: 104 mmol/L (ref 101–111)
GFR calc Af Amer: >60 mL/min (ref >60)
GLUCOSE: 86 mg/dL (ref 65–99)
Potassium: 3.3 mmol/L — ABNORMAL LOW (ref 3.5–5.1)
Sodium: 137 mmol/L (ref 135–145)
TOTAL PROTEIN: 7.5 g/dL (ref 6.5–8.1)

## 2017-02-25 LAB — URINALYSIS, ROUTINE W REFLEX MICROSCOPIC
Bilirubin Urine: NEGATIVE
Glucose, UA: NEGATIVE mg/dL
HGB URINE DIPSTICK: NEGATIVE
Ketones, ur: NEGATIVE mg/dL
LEUKOCYTES UA: NEGATIVE
Nitrite: NEGATIVE
PH: 5 (ref 5.0–8.0)
PROTEIN: NEGATIVE mg/dL
Specific Gravity, Urine: 1.021 (ref 1.005–1.030)

## 2017-02-25 LAB — LIPASE, BLOOD: LIPASE: 15 U/L (ref 11–51)

## 2017-02-25 LAB — I-STAT BETA HCG BLOOD, ED (MC, WL, AP ONLY)

## 2017-02-25 LAB — HCG, QUANTITATIVE, PREGNANCY: hCG, Beta Chain, Quant, S: 17374 m[IU]/mL — ABNORMAL HIGH (ref <5)

## 2017-02-25 MED ORDER — METRONIDAZOLE 500 MG PO TABS: 500.0000 mg | ORAL_TABLET | Freq: Two times a day (BID) | ORAL | 0 refills | Status: DC

## 2017-02-25 NOTE — ED Notes (Signed)
PA at bedside.

## 2017-02-25 NOTE — ED Notes (Signed)
Was told in [place] that she was preg and that she needed an US, is visiting today [place] and  Thought she would come to ER for that US, having left lower abd pain , also having a d/c  That has an odor she needs to have tx, nausea no vomiting she states

## 2017-02-25 NOTE — ED Provider Notes (Signed)
[facility]-EMERGENCY DEPT Provider Note   CSN: [id number] Arrival date & time: 02/25/17  1021  By signing my name below, I, Majel Homer, attest that this documentation has been prepared under the direction and in the presence of Terance Hart, PA-C . Electronically Signed: Majel Homer, Scribe. 02/25/2017. 12:54 PM.  History   Chief Complaint Chief Complaint  Patient presents with  . Possible Pregnancy   The history is provided by the patient. No language interpreter was used.   HPI Comments: Samantha Rojas is a 21 y.o. female with PMHx of chlamydia, who presents to the Emergency Department for a follow-up evaluation of a possible pregnancy. Pt reports she does not "live in the area" and visited a ED ~2 weeks ago in which she was reported to be "just under [redacted] weeks pregnant." She notes her last menstrual period was on 12/15/16. She states she has returned to [facility] today for a funeral and decided to get evaluated in the ED for left-sided abdominal "cramping" with associated diarrhea that began ~2 weeks ago, nausea, and "white, odorous" vaginal discharge. Pt reports she does not currently have an OB/GYN. She denies any vomiting or vaginal bleeding.   Pt also complains of gradually worsening, left ear pain and difficulty hearing out of her right ear. She states her husband recently experienced similar pain that has now resolved. She notes she has poured peroxide and water into her ear with no relief. She denies any other complaints.   Past Medical History:  Diagnosis Date  . Asthma    There are no active problems to display for this patient.  History reviewed. No pertinent surgical history.  OB History    Gravida Para Term Preterm AB Living   1             SAB TAB Ectopic Multiple Live Births                 Home Medications    Prior to Admission medications   Medication Sig Start Date End Date Taking? Authorizing Provider  benzonatate (TESSALON) 100 MG capsule Take 1 capsule (100 mg  total) by mouth 3 (three) times daily as needed for cough. 04/05/15   Trixie Dredge, PA-C  clindamycin (CLEOCIN) 300 MG capsule Take 1 capsule (300 mg total) by mouth 3 (three) times daily. 07/03/14   Graylon Good, PA-C  HYDROcodone-acetaminophen (NORCO/VICODIN) 5-325 MG per tablet Take 1 tablet by mouth every 4 (four) hours as needed for moderate pain. 06/13/14   Burgess Amor, PA-C  ibuprofen (ADVIL,MOTRIN) 800 MG tablet Take 1 tablet (800 mg total) by mouth 3 (three) times daily. 05/17/15   Oswaldo Conroy, PA-C  loperamide (IMODIUM) 2 MG capsule Take 1 capsule (2 mg total) by mouth 4 (four) times daily as needed for diarrhea or loose stools. 02/22/15   Graylon Good, PA-C  oxymetazoline (AFRIN NASAL SPRAY) 0.05 % nasal spray Place 1 spray into both nostrils 2 (two) times daily. X 3 days 04/05/15   Trixie Dredge, PA-C    Family History No family history on file.  Social History Social History  Substance Use Topics  . Smoking status: Never Smoker  . Smokeless tobacco: Never Used  . Alcohol use No   Allergies   Patient has no known allergies.  Review of Systems Review of Systems  HENT: Positive for ear pain and hearing loss.   Gastrointestinal: Positive for abdominal pain (abdominal "cramping" ), diarrhea and nausea. Negative for vomiting.  Genitourinary: Positive for vaginal discharge.  Negative for vaginal bleeding.  All other systems reviewed and are negative.  Physical Exam Updated Vital Signs BP 117/71 (BP Location: Left Arm)   Pulse (!) 57   Temp 98.6 F (37 C) (Oral)   Resp 16   Ht 5\' 8"  (1.727 m)   Wt 145 lb (65.8 kg)   SpO2 100%   BMI 22.05 kg/m   Physical Exam  Constitutional: She is oriented to person, place, and time. She appears well-developed and well-nourished.  HENT:  Head: Normocephalic.  Left TM effusion. Right TM has cerumen impaction.   Eyes: EOM are normal.  Neck: Normal range of motion.  Pulmonary/Chest: Effort normal.  Abdominal: She exhibits no  distension.  Genitourinary:  Genitourinary Comments: Very mild left sided pelvic tenderness. Some thick, white discharge in the vaginal vault. Mild spotting. Left sided adnexal tenderness. Chaperone was present for exam which was performed with no discomfort or complications.   Musculoskeletal: Normal range of motion.  Neurological: She is alert and oriented to person, place, and time.  Psychiatric: She has a normal mood and affect.  Nursing note and vitals reviewed.  ED Treatments / Results  DIAGNOSTIC STUDIES:  Oxygen Saturation is 100% on RA, normal by my interpretation.    COORDINATION OF CARE:  12:40 PM Discussed treatment plan with pt at bedside and pt agreed to plan.  Labs (all labs ordered are listed, but only abnormal results are displayed) Labs Reviewed  WET PREP, GENITAL - Abnormal; Notable for the following:       Result Value   Clue Cells Wet Prep HPF POC PRESENT (*)    WBC, Wet Prep HPF POC MODERATE (*)    All other components within normal limits  CBC WITH DIFFERENTIAL/PLATELET - Abnormal; Notable for the following:    WBC 3.5 (*)    HCT 35.0 (*)    All other components within normal limits  COMPREHENSIVE METABOLIC PANEL - Abnormal; Notable for the following:    Potassium 3.3 (*)    Alkaline Phosphatase 31 (*)    All other components within normal limits  HCG, QUANTITATIVE, PREGNANCY - Abnormal; Notable for the following:    hCG, Beta Chain, Quant, S 17,374 (*)    All other components within normal limits  I-STAT BETA HCG BLOOD, ED ([facility], [facility], [facility] ONLY) - Abnormal; Notable for the following:    I-stat hCG, quantitative >2,000.0 (*)    All other components within normal limits  LIPASE, BLOOD  URINALYSIS, ROUTINE W REFLEX MICROSCOPIC  RPR  HIV ANTIBODY (ROUTINE TESTING)  GC/CHLAMYDIA PROBE AMP ([place]) NOT AT [facility]    EKG  EKG Interpretation None      Radiology US Ob Comp < 14 Wks  Result Date: 02/25/2017 CLINICAL DATA:  Left-sided pain. EXAM:  OBSTETRIC <14 WK [place] AND TRANSVAGINAL OB US TECHNIQUE: Both transabdominal and transvaginal ultrasound examinations were performed for complete evaluation of the gestation as well as the maternal uterus, adnexal regions, and pelvic cul-de-sac. Transvaginal technique was performed to assess early pregnancy. COMPARISON:  None. FINDINGS: Intrauterine gestational sac: Single Yolk sac:  Present Embryo:  Present Cardiac Activity: Not visualized CRL:  0.3 cm 6 w   0 d                  [place] EDC: 10/21/2017 Subchorionic hemorrhage:  Moderate subchorionic hemorrhage. Maternal uterus/adnexae: Small amount of free pelvic fluid. IMPRESSION: 1. Single viable intrauterine pregnancy at 6 weeks 0 days. No cardiac activity noted at this time. This may  be secondary to the early age of the pregnancy. Nonviable pregnancy however cannot be excluded. Recommend follow-up US in 10-14 days for definitive diagnosis. This recommendation follows SRU consensus guidelines: Diagnostic Criteria for Nonviable Pregnancy Early in the First Trimester. Malva Limes Engl J Med 2013; [id number]:8413-24; 369:1443-51. 2. Moderate size subchorionic hemorrhage. Electronically Signed   By: Maisie Fushomas  Register   On: 02/25/2017 15:16   [place] Ob Transvaginal  Result Date: 02/25/2017 CLINICAL DATA:  Left-sided pain. EXAM: OBSTETRIC <14 WK US AND TRANSVAGINAL OB US TECHNIQUE: Both transabdominal and transvaginal ultrasound examinations were performed for complete evaluation of the gestation as well as the maternal uterus, adnexal regions, and pelvic cul-de-sac. Transvaginal technique was performed to assess early pregnancy. COMPARISON:  None. FINDINGS: Intrauterine gestational sac: Single Yolk sac:  Present Embryo:  Present Cardiac Activity: Not visualized CRL:  0.3 cm 6 w   0 d                  US EDC: 10/21/2017 Subchorionic hemorrhage:  Moderate subchorionic hemorrhage. Maternal uterus/adnexae: Small amount of free pelvic fluid. IMPRESSION: 1. Single viable intrauterine pregnancy at 6 weeks 0  days. No cardiac activity noted at this time. This may be secondary to the early age of the pregnancy. Nonviable pregnancy however cannot be excluded. Recommend follow-up US in 10-14 days for definitive diagnosis. This recommendation follows SRU consensus guidelines: Diagnostic Criteria for Nonviable Pregnancy Early in the First Trimester. Malva Limes Engl J Med 2013; 401:0272-53; 369:1443-51. 2. Moderate size subchorionic hemorrhage. Electronically Signed   By: Maisie Fushomas  Register   On: 02/25/2017 15:16    Procedures Pelvic exam Date/Time: 02/25/2017 12:41 PM Performed by: Bethel BornGEKAS, Geri Hepler MARIE Authorized by: Bethel BornGEKAS, Jaylin Benzel MARIE  Consent: Verbal consent obtained. Risks and benefits: risks, benefits and alternatives were discussed Consent given by: patient Patient understanding: patient states understanding of the procedure being performed Patient consent: the patient's understanding of the procedure matches consent given Patient identity confirmed: verbally with patient    (including critical care time)  Medications Ordered in ED Medications - No data to display  Initial Impression / Assessment and Plan / ED Course  I have reviewed the triage vital signs and the nursing notes.  Pertinent labs & imaging results that were available during my care of the patient were reviewed by me and considered in my medical decision making (see chart for details).  21 year old female who is ~[redacted] weeks pregnant. Vitals are normal. Labs overall unremarkable. Hcg is rising appropriately. UA is clean. Wet prep remarkable for Clue cells and moderate WBC. Will treat since she is symptomatic with Flagyl. G&C, HIV, RPR sent. US shows IUP without cardiac activity. Strongly advised pt to follow up in 2 weeks for prenatal care. Discussed with her at length about this. She verbalized understanding. Also recommended Zyrtec for ear effusion. Return precautions given.   I personally performed the services described in this documentation, which was  scribed in my presence. The recorded information has been reviewed and is accurate.   Final Clinical Impressions(s) / ED Diagnoses   Final diagnoses:  First trimester pregnancy  BV (bacterial vaginosis)  Acute effusion of left ear    New Prescriptions New Prescriptions   No medications on file     Bethel BornKelly Marie Chanya Chrisley, PA-C 02/25/17 1619    Rolan BuccoMelanie Belfi, MD 02/25/17 [phone]

## 2017-02-25 NOTE — Discharge Instructions (Signed)
Make an appointment with OBGYN for prenatal care Take Zyrtec for ear fullness - this is safe in pregnancy Take Flagyl twice daily for treatment of vaginal discharge for the next week

## 2017-02-25 NOTE — ED Triage Notes (Signed)
Per Pt, Pt is coming from home with reports for need of US after being reported to be pregnant. Pt reports she was seen two weeks ago and asked to have follow-up in two weeks. Reports thick white vaginal discharge. Complains of right ear decreased in hearing.

## 2017-02-25 NOTE — ED Notes (Addendum)
Pt came to nursing station wanted to speak with PA.

## 2017-02-26 LAB — GC/CHLAMYDIA PROBE AMP (~~LOC~~) NOT AT ARMC
Chlamydia: NEGATIVE
Neisseria Gonorrhea: NEGATIVE

## 2017-02-26 LAB — HIV ANTIBODY (ROUTINE TESTING W REFLEX): HIV Screen 4th Generation wRfx: NONREACTIVE

## 2017-02-26 LAB — RPR: RPR: NONREACTIVE

## 2017-03-05 ENCOUNTER — Ambulatory Visit (HOSPITAL_COMMUNITY): Admission: EM | Admit: 2017-03-05 | Discharge: 2017-03-05 | Discharge status: Absent without leave

## 2017-03-06 ENCOUNTER — Inpatient Hospital Stay (HOSPITAL_COMMUNITY)

## 2017-03-06 ENCOUNTER — Inpatient Hospital Stay (HOSPITAL_COMMUNITY)
Admission: AD | Admit: 2017-03-06 | Discharge: 2017-03-06 | Disposition: A | Discharge status: Home / Self Care | Source: Ambulatory Visit | Attending: Obstetrics & Gynecology | Admitting: Obstetrics & Gynecology

## 2017-03-06 ENCOUNTER — Encounter (HOSPITAL_COMMUNITY): Payer: Self-pay

## 2017-03-06 DIAGNOSIS — O034 Incomplete spontaneous abortion without complication: Secondary | ICD-10-CM | POA: Diagnosis not present

## 2017-03-06 DIAGNOSIS — J45909 Unspecified asthma, uncomplicated: Secondary | ICD-10-CM | POA: Insufficient documentation

## 2017-03-06 DIAGNOSIS — O209 Hemorrhage in early pregnancy, unspecified: Secondary | ICD-10-CM | POA: Diagnosis present

## 2017-03-06 DIAGNOSIS — O99511 Diseases of the respiratory system complicating pregnancy, first trimester: Secondary | ICD-10-CM | POA: Diagnosis not present

## 2017-03-06 DIAGNOSIS — Z3A01 Less than 8 weeks gestation of pregnancy: Secondary | ICD-10-CM | POA: Diagnosis not present

## 2017-03-06 LAB — URINALYSIS, ROUTINE W REFLEX MICROSCOPIC
Bilirubin Urine: NEGATIVE
Glucose, UA: NEGATIVE mg/dL
Ketones, ur: 5 mg/dL — AB
Nitrite: NEGATIVE
Protein, ur: NEGATIVE mg/dL
Specific Gravity, Urine: 1.032 — ABNORMAL HIGH (ref 1.005–1.030)
pH: 6 (ref 5.0–8.0)

## 2017-03-06 MED ORDER — IBUPROFEN 600 MG PO TABS: 600.0000 mg | ORAL_TABLET | Freq: Four times a day (QID) | ORAL | 0 refills | Status: DC | PRN

## 2017-03-06 MED ORDER — IBUPROFEN 600 MG PO TABS
600.0000 mg | ORAL_TABLET | Freq: Once | ORAL | Status: AC
  Administered 2017-03-06: 600 mg via ORAL
  Filled 2017-03-06: qty 1

## 2017-03-06 MED ORDER — HYDROCODONE-ACETAMINOPHEN 5-325 MG PO TABS: 1.0000 | ORAL_TABLET | ORAL | 0 refills | Status: DC | PRN

## 2017-03-06 NOTE — MAU Provider Note (Signed)
History     CSN: [id number]  Arrival date and time: 03/06/17 4098   First Provider Initiated Contact with Patient 03/06/17 2309      Chief Complaint  Patient presents with  . Possible Pregnancy   HPI   Ms.Samantha Rojas is a 21 y.o. female G1P0 @ [redacted]w[redacted]d Vaginal bleeding that started yesterday. The bleeding was brown yesterday, and progressed to bright red vaginal bleeding today. The bleeding today is heavy like a menstrual cycle.  Mild cramps that come and go, currently rates her pain 4/10. Patient is miliary and here in [place] visiting.  She was seen at [facility] on 3/22 and had a full pregnancy workup. US showed an IUP. She had only mild spotting at that time.   OB History    Gravida Para Term Preterm AB Living   1             SAB TAB Ectopic Multiple Live Births                  Past Medical History:  Diagnosis Date  . Asthma     No past surgical history on file.  No family history on file.  Social History  Substance Use Topics  . Smoking status: Never Smoker  . Smokeless tobacco: Never Used  . Alcohol use No    Allergies: No Known Allergies  Prescriptions Prior to Admission  Medication Sig Dispense Refill Last Dose  . benzonatate (TESSALON) 100 MG capsule Take 1 capsule (100 mg total) by mouth 3 (three) times daily as needed for cough. 21 capsule 0   . clindamycin (CLEOCIN) 300 MG capsule Take 1 capsule (300 mg total) by mouth 3 (three) times daily. 21 capsule 0   . HYDROcodone-acetaminophen (NORCO/VICODIN) 5-325 MG per tablet Take 1 tablet by mouth every 4 (four) hours as needed for moderate pain. 15 tablet 0   . ibuprofen (ADVIL,MOTRIN) 800 MG tablet Take 1 tablet (800 mg total) by mouth 3 (three) times daily. 21 tablet 0   . loperamide (IMODIUM) 2 MG capsule Take 1 capsule (2 mg total) by mouth 4 (four) times daily as needed for diarrhea or loose stools. 12 capsule 0   . metroNIDAZOLE (FLAGYL) 500 MG tablet Take 1 tablet (500 mg total) by mouth 2  (two) times daily. 14 tablet 0   . oxymetazoline (AFRIN NASAL SPRAY) 0.05 % nasal spray Place 1 spray into both nostrils 2 (two) times daily. X 3 days 30 mL 0    Results for orders placed or performed during the hospital encounter of 03/06/17 (from the past 48 hour(s))  Urinalysis, Routine w reflex microscopic     Status: Abnormal   Collection Time: 03/06/17  6:30 PM  Result Value Ref Range   Color, Urine YELLOW YELLOW   APPearance HAZY (A) CLEAR   Specific Gravity, Urine 1.032 (H) 1.005 - 1.030   pH 6.0 5.0 - 8.0   Glucose, UA NEGATIVE NEGATIVE mg/dL   Hgb urine dipstick MODERATE (A) NEGATIVE   Bilirubin Urine NEGATIVE NEGATIVE   Ketones, ur 5 (A) NEGATIVE mg/dL   Protein, ur NEGATIVE NEGATIVE mg/dL   Nitrite NEGATIVE NEGATIVE   Leukocytes, UA MODERATE (A) NEGATIVE   RBC / HPF 0-5 0 - 5 RBC/hpf   WBC, UA 0-5 0 - 5 WBC/hpf   Bacteria, UA RARE (A) NONE SEEN   Squamous Epithelial / LPF 6-30 (A) NONE SEEN   Mucous PRESENT   ABO/Rh     Status: None  Collection Time: 03/06/17  8:12 PM  Result Value Ref Range   ABO/RH(D) B POS    US Ob Transvaginal  Result Date: 03/06/2017 CLINICAL DATA:  Acute onset of vaginal bleeding.  Initial encounter. EXAM: TRANSVAGINAL OB ULTRASOUND TECHNIQUE: Transvaginal ultrasound was performed for complete evaluation of the gestation as well as the maternal uterus, adnexal regions, and pelvic cul-de-sac. COMPARISON:  Pelvic ultrasound performed 02/25/2017 FINDINGS: Intrauterine gestational sac: Single; visualized and normal in shape. Yolk sac:  No Embryo:  Possibly seen Cardiac Activity: N/A CRL: 2.0 mm 5 w 5 d [place] EDC: Too early to determine Subchorionic hemorrhage:  None visualized. Maternal uterus/adnexae: The uterus is otherwise unremarkable. The ovaries are within normal limits. The right ovary measures up to 3.4 x 1.7 x 1.7 cm, while the left ovary measures 2.5 x 2.4 x 1.7 cm. No suspicious adnexal masses are seen; there is no evidence for ovarian torsion. No  free fluid is seen within the pelvic cul-de-sac. IMPRESSION: Single intrauterine gestational sac. Possible embryo visualized, with a crown-rump length of 2 mm, reflecting a gestational age of [redacted] weeks 5 days. This does not match the gestational age by LMP, though it remains slightly too early to determine a new estimated date of delivery. If the quantitative beta HCG continues to rise, follow-up pelvic ultrasound could be performed in 2 weeks for further evaluation. Electronically Signed   By: Roanna Raider M.D.   On: 03/06/2017 22:56   Review of Systems  Gastrointestinal: Positive for abdominal pain (Minor lower abdominal cramping. ).  Genitourinary: Positive for vaginal bleeding.  Neurological: Negative for dizziness and light-headedness.   Physical Exam   Blood pressure 133/77, pulse (!) 57, resp. rate 18, last menstrual period 12/15/2016.  Physical Exam  Constitutional: She is oriented to person, place, and time. She appears well-developed and well-nourished. No distress.  HENT:  Head: Normocephalic.  Eyes: Pupils are equal, round, and reactive to light.  Respiratory: Effort normal.  GI: Soft. She exhibits no distension. There is no tenderness. There is no rebound and no guarding.  Musculoskeletal: Normal range of motion.  Neurological: She is alert and oriented to person, place, and time.  Skin: Skin is warm. She is not diaphoretic.  Psychiatric: Her behavior is normal.    MAU Course  Procedures  None  MDM  B positive blood type   No beds available at this time; orders placed while patient in the lobby. Patient made aware and is agreeable to plan of care.   [place] 3/22: CRL:  0.3 cm 6 w   0 d   [place] 3/31: CRL: 2.0 mm 5 w 5 d, yolk sac no longer seen.   Discussed [place] in detail with the patient, report from today and from one week ago compared, failed pregnancy suggested today.  Patient interested in watchful waiting due to leaving [facility] this week due to [employer]. Patient plans  to follow up with OB there. She is aware of reasons that would warrant an immediate return such as worsening pain, heavy vaginal bleeding causing dizziness.   Assessment and Plan   A:  1. Incomplete miscarriage   2. Vaginal bleeding in pregnancy, first trimester     P:  Discharge home in stable condition Return to MAU as needed, if symptoms worsen Follow up with OB back home Bleeding precautions Pelvic rest Support given/ comfort pack given Ibuprofen 600 mg given prior to DC Rx: Vicodin, ibuprofen.    Duane Lope, NP 03/08/2017 8:27 AM

## 2017-03-06 NOTE — Progress Notes (Signed)
WRitten and verbal d/c instructions given and understanding voiced.  

## 2017-03-06 NOTE — MAU Note (Signed)
Jennifer Rasch NP in Triage to discuss test results and d/c plan with pt. Pt d/c home from Triage 

## 2017-03-06 NOTE — Discharge Instructions (Signed)
Miscarriage A miscarriage is the loss of an unborn baby (fetus) before the 20th week of pregnancy. The cause is often unknown. Follow these instructions at home:  You may need to stay in bed (bed rest), or you may be able to do light activity. Go about activity as told by your doctor.  Have help at home.  Write down how many pads you use each day. Write down how soaked they are.  Do not use tampons. Do not wash out your vagina (douche) or have sex (intercourse) until your doctor approves.  Only take medicine as told by your doctor.  Do not take aspirin.  Keep all doctor visits as told.  If you or your partner have problems with grieving, talk to your doctor. You can also try counseling. Give yourself time to grieve before trying to get pregnant again. Get help right away if:  You have bad cramps or pain in your back or belly (abdomen).  You have a fever.  You pass large clumps of blood (clots) from your vagina that are walnut-sized or larger. Save the clumps for your doctor to see.  You pass large amounts of tissue from your vagina. Save the tissue for your doctor to see.  You have more bleeding.  You have thick, bad-smelling fluid (discharge) coming from the vagina.  You get lightheaded, weak, or you pass out (faint).  You have chills. This information is not intended to replace advice given to you by your health care provider. Make sure you discuss any questions you have with your health care provider. Document Released: 02/15/2012 Document Revised: 04/30/2016 Document Reviewed: 12/24/2011 Elsevier Interactive Patient Education  2017 [employer].  Incomplete Miscarriage A miscarriage is the sudden loss of an unborn baby (fetus) before the 20th week of pregnancy. In an incomplete miscarriage, parts of the fetus or placenta (afterbirth) remain in the body. Having a miscarriage can be an emotional experience. Talk with your health care provider about any questions you may  have about miscarrying, the grieving process, and your future pregnancy plans. What are the causes?  Problems with the fetal chromosomes that make it impossible for the baby to develop normally. Problems with the baby's genes or chromosomes are most often the result of errors that occur by chance as the embryo divides and grows. The problems are not inherited from the parents.  Infection of the cervix or uterus.  Hormone problems.  Problems with the cervix, such as having an incompetent cervix. This is when the tissue in the cervix is not strong enough to hold the pregnancy.  Problems with the uterus, such as an abnormally shaped uterus, uterine fibroids, or congenital abnormalities.  Certain medical conditions.  Smoking, drinking alcohol, or taking illegal drugs.  Trauma. What are the signs or symptoms?  Vaginal bleeding or spotting, with or without cramps or pain.  Pain or cramping in the abdomen or lower back.  Passing fluid, tissue, or blood clots from the vagina. How is this diagnosed? Your health care provider will perform a physical exam. You may also have an ultrasound to confirm the miscarriage. Blood or urine tests may also be ordered. How is this treated?  Usually, a dilation and curettage (D&C) procedure is performed. During a D&C procedure, the cervix is widened (dilated) and any remaining fetal or placental tissue is gently removed from the uterus.  Antibiotic medicines are prescribed if there is an infection. Other medicines may be given to reduce the size of the uterus (contract) if  there is a lot of bleeding.  If you have Rh negative blood and your baby was Rh positive, you will need a Rho (D) immune globulin shot. This shot will protect any future baby from having Rh blood problems in future pregnancies.  You may be confined to bed rest. This means you should stay in bed and only get up to use the bathroom. Follow these instructions at home:  Rest as directed  by your health care provider.  Restrict activity as directed by your health care provider. You may be allowed to continue light activity if curettage was not done but you require further treatment.  Keep track of the number of pads you use each day. Keep track of how soaked (saturated) they are. Record this information.  Do not  use tampons.  Do not douche or have sexual intercourse until approved by your health care provider.  Keep all follow-up appointments for reevaluation and continuing management.  Only take over-the-counter or prescription medicines for pain, fever, or discomfort as directed by your health care provider.  Take antibiotic medicine as directed by your health care provider. Make sure you finish it even if you start to feel better. Get help right away if:  You experience severe cramps in your stomach, back, or abdomen.  You have an unexplained temperature (make sure to record these temperatures).  You pass large clots or tissue (save these for your health care provider to inspect).  Your bleeding increases.  You become light-headed, weak, or have fainting episodes. This information is not intended to replace advice given to you by your health care provider. Make sure you discuss any questions you have with your health care provider. Document Released: 11/23/2005 Document Revised: 04/30/2016 Document Reviewed: 06/22/2013 Elsevier Interactive Patient Education  2017 [employer].
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
## 2017-03-07 LAB — ABO/RH: ABO/RH(D): B POS

## 2017-08-28 ENCOUNTER — Encounter (HOSPITAL_COMMUNITY): Payer: Self-pay | Admitting: Emergency Medicine

## 2017-08-28 DIAGNOSIS — T22211A Burn of second degree of right forearm, initial encounter: Secondary | ICD-10-CM | POA: Diagnosis not present

## 2017-08-28 DIAGNOSIS — X19XXXA Contact with other heat and hot substances, initial encounter: Secondary | ICD-10-CM | POA: Insufficient documentation

## 2017-08-28 DIAGNOSIS — T2010XA Burn of first degree of head, face, and neck, unspecified site, initial encounter: Secondary | ICD-10-CM | POA: Insufficient documentation

## 2017-08-28 DIAGNOSIS — Y999 Unspecified external cause status: Secondary | ICD-10-CM | POA: Insufficient documentation

## 2017-08-28 DIAGNOSIS — T22031A Burn of unspecified degree of right upper arm, initial encounter: Secondary | ICD-10-CM | POA: Diagnosis present

## 2017-08-28 DIAGNOSIS — J45909 Unspecified asthma, uncomplicated: Secondary | ICD-10-CM | POA: Insufficient documentation

## 2017-08-28 DIAGNOSIS — Y9389 Activity, other specified: Secondary | ICD-10-CM | POA: Insufficient documentation

## 2017-08-28 DIAGNOSIS — Y929 Unspecified place or not applicable: Secondary | ICD-10-CM | POA: Insufficient documentation

## 2017-08-28 DIAGNOSIS — Z79899 Other long term (current) drug therapy: Secondary | ICD-10-CM | POA: Diagnosis not present

## 2017-08-28 MED ORDER — OXYCODONE-ACETAMINOPHEN 5-325 MG PO TABS
1.0000 | ORAL_TABLET | Freq: Once | ORAL | Status: AC
  Administered 2017-08-28: 1 via ORAL

## 2017-08-28 MED ORDER — IBUPROFEN 400 MG PO TABS
400.0000 mg | ORAL_TABLET | Freq: Once | ORAL | Status: AC | PRN
  Administered 2017-08-28: 400 mg via ORAL

## 2017-08-28 MED ORDER — IBUPROFEN 400 MG PO TABS
ORAL_TABLET | ORAL | Status: AC
  Filled 2017-08-28: qty 1

## 2017-08-28 MED ORDER — OXYCODONE-ACETAMINOPHEN 5-325 MG PO TABS
ORAL_TABLET | ORAL | Status: AC
  Filled 2017-08-28: qty 1
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
## 2017-08-29 ENCOUNTER — Emergency Department (HOSPITAL_COMMUNITY)
Admission: EM | Admit: 2017-08-29 | Discharge: 2017-08-29 | Disposition: A | Discharge status: Home / Self Care | Attending: Emergency Medicine | Admitting: Emergency Medicine

## 2017-08-29 DIAGNOSIS — T2010XA Burn of first degree of head, face, and neck, unspecified site, initial encounter: Secondary | ICD-10-CM

## 2017-08-29 DIAGNOSIS — T22211A Burn of second degree of right forearm, initial encounter: Secondary | ICD-10-CM

## 2017-08-29 MED ORDER — SILVER SULFADIAZINE 1 % EX CREA
TOPICAL_CREAM | Freq: Once | CUTANEOUS | Status: AC
  Administered 2017-08-29: 04:00:00 via TOPICAL
  Filled 2017-08-29: qty 85

## 2017-08-29 MED ORDER — OXYCODONE-ACETAMINOPHEN 5-325 MG PO TABS: 1.0000 | ORAL_TABLET | ORAL | 0 refills | Status: DC | PRN

## 2017-08-29 MED ORDER — OXYCODONE-ACETAMINOPHEN 5-325 MG PO TABS
1.0000 | ORAL_TABLET | Freq: Once | ORAL | Status: AC
  Administered 2017-08-29: 1 via ORAL
  Filled 2017-08-29: qty 1

## 2017-08-29 MED ORDER — SILVER SULFADIAZINE 1 % EX CREA: 1.0000 "application " | TOPICAL_CREAM | Freq: Every day | CUTANEOUS | 0 refills | Status: DC

## 2017-08-29 NOTE — Discharge Instructions (Signed)
Continue to use cool washcloths as needed. Take ibuprofen as needed for pain, take oxycodone-acetaminophen as needed for more severe pain.  Only use the silver sulfadiazine cream on your arm. You may use Bacitracin or Neosporin ointment on your face.  Return here if you are having any problems.
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
## 2017-08-30 ENCOUNTER — Emergency Department (HOSPITAL_COMMUNITY)
Admission: EM | Admit: 2017-08-30 | Discharge: 2017-08-30 | Disposition: A | Discharge status: Home / Self Care | Attending: Emergency Medicine | Admitting: Emergency Medicine

## 2017-08-30 ENCOUNTER — Encounter (HOSPITAL_COMMUNITY): Payer: Self-pay

## 2017-08-30 DIAGNOSIS — T22011D Burn of unspecified degree of right forearm, subsequent encounter: Secondary | ICD-10-CM | POA: Diagnosis not present

## 2017-08-30 DIAGNOSIS — R05 Cough: Secondary | ICD-10-CM | POA: Insufficient documentation

## 2017-08-30 DIAGNOSIS — R059 Cough, unspecified: Secondary | ICD-10-CM

## 2017-08-30 DIAGNOSIS — X19XXXD Contact with other heat and hot substances, subsequent encounter: Secondary | ICD-10-CM | POA: Diagnosis not present

## 2017-08-30 DIAGNOSIS — Z79899 Other long term (current) drug therapy: Secondary | ICD-10-CM | POA: Diagnosis not present

## 2017-08-30 DIAGNOSIS — T2000XD Burn of unspecified degree of head, face, and neck, unspecified site, subsequent encounter: Secondary | ICD-10-CM | POA: Diagnosis present

## 2017-08-30 DIAGNOSIS — J45909 Unspecified asthma, uncomplicated: Secondary | ICD-10-CM | POA: Diagnosis not present

## 2017-08-30 DIAGNOSIS — Z5189 Encounter for other specified aftercare: Secondary | ICD-10-CM

## 2017-08-30 LAB — POC URINE PREG, ED: Preg Test, Ur: NEGATIVE

## 2017-08-30 MED ORDER — BENZONATATE 100 MG PO CAPS: 100.0000 mg | ORAL_CAPSULE | Freq: Three times a day (TID) | ORAL | 0 refills | Status: DC | PRN
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
## 2017-08-31 ENCOUNTER — Encounter (HOSPITAL_COMMUNITY): Payer: Self-pay | Admitting: Emergency Medicine

## 2017-08-31 ENCOUNTER — Emergency Department (HOSPITAL_COMMUNITY)
Admission: EM | Admit: 2017-08-31 | Discharge: 2017-08-31 | Disposition: A | Discharge status: Home / Self Care | Attending: Emergency Medicine | Admitting: Emergency Medicine

## 2017-08-31 DIAGNOSIS — J45909 Unspecified asthma, uncomplicated: Secondary | ICD-10-CM | POA: Insufficient documentation

## 2017-08-31 DIAGNOSIS — T310 Burns involving less than 10% of body surface: Secondary | ICD-10-CM | POA: Diagnosis not present

## 2017-08-31 DIAGNOSIS — T23291A Burn of second degree of multiple sites of right wrist and hand, initial encounter: Secondary | ICD-10-CM | POA: Diagnosis present

## 2017-08-31 DIAGNOSIS — T23271D Burn of second degree of right wrist, subsequent encounter: Secondary | ICD-10-CM

## 2017-08-31 DIAGNOSIS — Y939 Activity, unspecified: Secondary | ICD-10-CM | POA: Insufficient documentation

## 2017-08-31 DIAGNOSIS — Y999 Unspecified external cause status: Secondary | ICD-10-CM | POA: Insufficient documentation

## 2017-08-31 DIAGNOSIS — X088XXA Exposure to other specified smoke, fire and flames, initial encounter: Secondary | ICD-10-CM | POA: Insufficient documentation

## 2017-08-31 DIAGNOSIS — Y929 Unspecified place or not applicable: Secondary | ICD-10-CM | POA: Diagnosis not present

## 2017-08-31 DIAGNOSIS — T23201D Burn of second degree of right hand, unspecified site, subsequent encounter: Secondary | ICD-10-CM

## 2017-08-31 MED ORDER — OXYCODONE-ACETAMINOPHEN 5-325 MG PO TABS: 1.0000 | ORAL_TABLET | Freq: Three times a day (TID) | ORAL | 0 refills | Status: DC | PRN

## 2017-08-31 NOTE — Discharge Instructions (Signed)
Continue Percocet as needed for severe pain Add Ibuprofen or Naproxen for extra pain relief Continue using neosporin on the face and silvadene on the wrist. Keep area covered.  Return for worsening symptoms

## 2017-08-31 NOTE — ED Notes (Signed)
See edp assessment 
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
## 2022-04-02 ENCOUNTER — Ambulatory Visit (HOSPITAL_COMMUNITY)

## 2022-04-06 ENCOUNTER — Emergency Department (HOSPITAL_COMMUNITY)
Admission: EM | Admit: 2022-04-06 | Discharge: 2022-04-06 | Disposition: A | Discharge status: Home / Self Care | Attending: Emergency Medicine | Admitting: Emergency Medicine

## 2022-04-06 ENCOUNTER — Encounter (HOSPITAL_COMMUNITY): Payer: Self-pay | Admitting: Emergency Medicine

## 2022-04-06 ENCOUNTER — Other Ambulatory Visit: Payer: Self-pay

## 2022-04-06 DIAGNOSIS — R6883 Chills (without fever): Secondary | ICD-10-CM | POA: Insufficient documentation

## 2022-04-06 DIAGNOSIS — R1032 Left lower quadrant pain: Secondary | ICD-10-CM | POA: Diagnosis not present

## 2022-04-06 DIAGNOSIS — R112 Nausea with vomiting, unspecified: Secondary | ICD-10-CM | POA: Diagnosis not present

## 2022-04-06 DIAGNOSIS — Z91040 Latex allergy status: Secondary | ICD-10-CM | POA: Insufficient documentation

## 2022-04-06 HISTORY — DX: Polycystic kidney, unspecified: Q61.3

## 2022-04-06 LAB — CBC
HCT: 35.9 % — ABNORMAL LOW (ref 36.0–46.0)
Hemoglobin: 12 g/dL (ref 12.0–15.0)
MCH: 29.6 pg (ref 26.0–34.0)
MCHC: 33.4 g/dL (ref 30.0–36.0)
MCV: 88.6 fL (ref 80.0–100.0)
Platelets: 273 10*3/uL (ref 150–400)
RBC: 4.05 MIL/uL (ref 3.87–5.11)
RDW: 13.3 % (ref 11.5–15.5)
WBC: 8 10*3/uL (ref 4.0–10.5)
nRBC: 0 % (ref 0.0–0.2)

## 2022-04-06 LAB — URINALYSIS, ROUTINE W REFLEX MICROSCOPIC
Bilirubin Urine: NEGATIVE
Glucose, UA: NEGATIVE mg/dL
Hgb urine dipstick: NEGATIVE
Ketones, ur: 20 mg/dL — AB
Leukocytes,Ua: NEGATIVE
Nitrite: NEGATIVE
Protein, ur: NEGATIVE mg/dL
Specific Gravity, Urine: 1.021 (ref 1.005–1.030)
pH: 7 (ref 5.0–8.0)

## 2022-04-06 LAB — COMPREHENSIVE METABOLIC PANEL
ALT: 24 U/L (ref 0–44)
AST: 28 U/L (ref 15–41)
Albumin: 4.1 g/dL (ref 3.5–5.0)
Alkaline Phosphatase: 30 U/L — ABNORMAL LOW (ref 38–126)
Anion gap: 12 (ref 5–15)
BUN: 12 mg/dL (ref 6–20)
CO2: 25 mmol/L (ref 22–32)
Calcium: 8.9 mg/dL (ref 8.9–10.3)
Chloride: 101 mmol/L (ref 98–111)
Creatinine, Ser: 0.84 mg/dL (ref 0.44–1.00)
GFR, Estimated: >60 mL/min (ref >60)
Glucose, Bld: 83 mg/dL (ref 70–99)
Potassium: 3.9 mmol/L (ref 3.5–5.1)
Sodium: 138 mmol/L (ref 135–145)
Total Bilirubin: 0.5 mg/dL (ref 0.3–1.2)
Total Protein: 7.7 g/dL (ref 6.5–8.1)

## 2022-04-06 LAB — I-STAT BETA HCG BLOOD, ED (MC, WL, AP ONLY): I-stat hCG, quantitative: <5 m[IU]/mL (ref <5)

## 2022-04-06 LAB — LIPASE, BLOOD: Lipase: 26 U/L (ref 11–51)

## 2022-04-06 MED ORDER — FENTANYL CITRATE PF 50 MCG/ML IJ SOSY
50.0000 ug | PREFILLED_SYRINGE | Freq: Once | INTRAMUSCULAR | Status: AC
  Administered 2022-04-06: 50 ug via INTRAVENOUS
  Filled 2022-04-06: qty 1

## 2022-04-06 MED ORDER — ONDANSETRON HCL 4 MG/2ML IJ SOLN
4.0000 mg | Freq: Once | INTRAMUSCULAR | Status: AC
  Administered 2022-04-06: 4 mg via INTRAVENOUS
  Filled 2022-04-06: qty 2

## 2022-04-06 MED ORDER — SODIUM CHLORIDE 0.9 % IV BOLUS
1000.0000 mL | Freq: Once | INTRAVENOUS | Status: AC
  Administered 2022-04-06: 1000 mL via INTRAVENOUS

## 2022-04-06 NOTE — ED Provider Notes (Signed)
?[facility] ?Provider Note ? ? ?CSN: [id number] ?Arrival date & time: 04/06/22  0327 ? ?  ? ?History ? ?Chief Complaint  ?Patient presents with  ? Abdominal Pain  ? ? ?Samantha Rojas is a 26 y.o. female. ? ?26 y.o female with no PMH presents to the ED with a chief complaint of nausea, vomiting x 10 since last night. Patient reports having some tequila the day earlier with drinking with her friends "might have been from tequila". She endorses pain along her "esophagus" described as burning sensation from all her emesis. She has not been able to hold anything down, including water. She does not have any prior of surgical intervention to her abdomen. Has not tried any medication for improvement in her symptoms.  Does endorse some lower abdominal pain, exacerbated along the left lower quadrant.  Last bowel movement was prior to arrival in the ED.  Denies any sick contacts, no fever, no diet changes. ? ?The history is provided by the patient.  ?Abdominal Pain ?Pain location:  LLQ ?Pain quality: cramping   ?Pain radiates to:  Does not radiate ?Pain severity:  Moderate ?Onset quality:  Sudden ?Duration:  12 hours ?Timing:  Constant ?Progression:  Improving ?Chronicity:  New ?Context: alcohol use and recent travel   ?Context: not diet changes, not eating, not recent illness, not sick contacts and not suspicious food intake   ?Relieved by:  Nothing ?Worsened by:  Nothing ?Ineffective treatments:  None tried ?Associated symptoms: chills, nausea and vomiting   ?Associated symptoms: no chest pain, no fever, no shortness of breath and no sore throat   ?Nausea:  ?  Severity:  Moderate ?  Onset quality:  Gradual ?Risk factors: no alcohol abuse, has not had multiple surgeries, not obese, not pregnant and no recent hospitalization   ? ?  ? ?Home Medications ?Prior to Admission medications   ?Medication Sig Start Date End Date Taking? Authorizing Provider  ?albuterol (VENTOLIN HFA) 108 (90  Base) MCG/ACT inhaler Inhale 2 puffs into the lungs 2 (two) times daily as needed for wheezing or shortness of breath.   Yes [provider]  ?amLODipine (NORVASC) 5 MG tablet Take 5 mg by mouth daily.   Yes [provider]  ?cetirizine (ZYRTEC) 10 MG tablet Take 10 mg by mouth daily.   Yes [provider]  ?ibuprofen (ADVIL) 800 MG tablet Take 800 mg by mouth 3 (three) times daily as needed for cramping (pain).   Yes [provider]  ?silver sulfADIAZINE (SILVADENE) 1 % cream Apply 1 application topically daily. ?Patient not taking: Reported on 04/06/2022 08/29/17   Dione BoozeGlick, David, MD  ?   ? ?Allergies    ?Latex and Toradol [ketorolac tromethamine]   ? ?Review of Systems   ?Review of Systems  ?Constitutional:  Positive for chills. Negative for fever.  ?HENT:  Negative for sore throat.   ?Respiratory:  Negative for shortness of breath.   ?Cardiovascular:  Negative for chest pain.  ?Gastrointestinal:  Positive for abdominal pain, nausea and vomiting.  ?Genitourinary:  Negative for flank pain.  ?Musculoskeletal:  Negative for back pain.  ?Skin:  Negative for pallor and wound.  ?Neurological:  Negative for light-headedness and headaches.  ?All other systems reviewed and are negative. ? ?Physical Exam ?Updated Vital Signs ?BP 124/77   Pulse 61   Temp 98 ?F (36.7 ?C) (Oral)   Resp 17   Ht 5\' 8"  (1.727 m)   Wt 63.5 kg   LMP  03/23/2022   SpO2 100%   BMI 21.29 kg/m?  ?Physical Exam ?Vitals and nursing note reviewed.  ?Constitutional:   ?   Appearance: She is well-developed. She is not ill-appearing.  ?HENT:  ?   Head: Normocephalic and atraumatic.  ?Cardiovascular:  ?   Rate and Rhythm: Normal rate.  ?Pulmonary:  ?   Effort: Pulmonary effort is normal.  ?   Breath sounds: No wheezing.  ?Abdominal:  ?   General: Abdomen is flat. Bowel sounds are decreased.  ?   Palpations: Abdomen is soft.  ?   Tenderness: There is abdominal tenderness in the suprapubic area and left lower quadrant.  There is no right CVA tenderness or left CVA tenderness. Negative signs include McBurney's sign.  ?Skin: ?   General: Skin is warm and dry.  ?Neurological:  ?   Mental Status: She is alert.  ? ? ?ED Results / Procedures / Treatments   ?Labs ?(all labs ordered are listed, but only abnormal results are displayed) ?Labs Reviewed  ?COMPREHENSIVE METABOLIC PANEL - Abnormal; Notable for the following components:  ?    Result Value  ? Alkaline Phosphatase 30 (*)   ? All other components within normal limits  ?CBC - Abnormal; Notable for the following components:  ? HCT 35.9 (*)   ? All other components within normal limits  ?URINALYSIS, ROUTINE W REFLEX MICROSCOPIC - Abnormal; Notable for the following components:  ? Ketones, ur 20 (*)   ? All other components within normal limits  ?LIPASE, BLOOD  ?I-STAT BETA HCG BLOOD, ED ([facility], [facility], [facility] ONLY)  ? ? ?EKG ?None ? ?Radiology ?No results found. ? ?Procedures ?Procedures  ? ? ?Medications Ordered in ED ?Medications  ?sodium chloride 0.9 % bolus 1,000 mL (1,000 mLs Intravenous New Bag/Given 04/06/22 0705)  ?fentaNYL (SUBLIMAZE) injection 50 mcg (50 mcg Intravenous Given 04/06/22 0706)  ?ondansetron [facility]) injection 4 mg (4 mg Intravenous Given 04/06/22 0706)  ? ? ?ED Course/ Medical Decision Making/ A&P ?  ?                        ?Medical Decision Making ?Amount and/or Complexity of Data Reviewed ?Labs: ordered. ? ?Risk ?Prescription drug management. ? ?This patient presents to the ED for concern of nausea, vomiting and abdominal pain, this involves a number of treatment options, and is a complaint that carries with it a high risk of complications and morbidity.  The differential diagnosis includes Reflux, diverticulitis, enteritis ? ? ?Co morbidities: ?Discussed in HPI ? ? ?Brief History: ? ?Healthy female with N/V which began yesterday after alcohol consumption, no prior history of alcohol abuse.  Endorsing some lower abdomen pain, localization along the left lower quadrant, did  have multiple episodes of diarrhea also but no blood in her stool.  No prior history of GI bleed.  No prior history of esophageal rupture. ? ?EMR reviewed including pt PMHx, past surgical history and past visits to ER.  ? ?See HPI for more details ? ? ?Lab Tests: ? ?I ordered and independently interpreted labs.  The pertinent results include:   ? ?I personally reviewed all laboratory work and imaging. Metabolic panel without any acute abnormality specifically kidney function within normal limits and no significant electrolyte abnormalities. CBC without leukocytosis or significant anemia. ? ? ?Imaging Studies: ? ?No imaging studies ordered for this patient ? ? ? ?Medicines ordered: ? ?I ordered medication including fentanyl, zofran, bolus  for symptomatic control ?Reevaluation of the patient  after these medicines showed that the patient resolved ?I have reviewed the patients home medicines and have made adjustments as needed ? ? ?Reevaluation: ? ?After the interventions noted above I re-evaluated patient and found that they have :resolved ? ? ?Social Determinants of Health: ? ?The patient's social determinants of health were a factor in the care of this patient ? ? ? ?Problem List / ED Course: ? ?Patient here with nausea, vomiting x10 since last night, does report some alcohol use yesterday, feeling some burning sensation to her chest and was worried that she had "ruptured her esophagus ", although no prior history of chronic alcohol use.  Pain along the left lower quadrant, and her entire abdomen.  Labs today are unremarkable.  Pregnancy test is negative, she was given fentanyl, Zofran, bolus to help with symptomatic treatment, was requesting food intake at this time.  Given crackers, water and tolerating p.o. intake.  I do feel that symptoms are likely related to alcohol use yesterday as stated per patient.  I do not feel that patient needs CT abdomen at this time, pain has improved, I have a low suspicion for  diverticulitis, suspicion for Boerhaave's, no prior history of chronic alcohol abuse.  ?Serial abdominal checks by me, with resolution in symptoms after medication and fluids. We discussed return precautions to the ED ve

## 2022-04-06 NOTE — ED Triage Notes (Signed)
Pt c/o left sided abd pain with n/v x 10 episodes since last night, denies diarrhea/fever/urinary symptoms; LBM today  ?

## 2022-04-06 NOTE — ED Notes (Signed)
Pt given graham crackers and ginger ale ?

## 2022-04-06 NOTE — Discharge Instructions (Addendum)
Your laboratory results were within normal limits.  Please continue to hydrate with plenty of fluids along with no use of alcohol for the next couple of days. ? ?If you experience any worsening abdominal pain, fever, please return to the ED.  ?

## 2022-05-08 ENCOUNTER — Emergency Department (HOSPITAL_COMMUNITY)
Admission: EM | Admit: 2022-05-08 | Discharge: 2022-05-08 | Disposition: A | Discharge status: Home / Self Care | Attending: Emergency Medicine | Admitting: Emergency Medicine

## 2022-05-08 ENCOUNTER — Other Ambulatory Visit: Payer: Self-pay

## 2022-05-08 ENCOUNTER — Encounter (HOSPITAL_COMMUNITY): Payer: Self-pay

## 2022-05-08 DIAGNOSIS — B379 Candidiasis, unspecified: Secondary | ICD-10-CM | POA: Insufficient documentation

## 2022-05-08 DIAGNOSIS — N760 Acute vaginitis: Secondary | ICD-10-CM | POA: Insufficient documentation

## 2022-05-08 DIAGNOSIS — J45909 Unspecified asthma, uncomplicated: Secondary | ICD-10-CM | POA: Diagnosis not present

## 2022-05-08 DIAGNOSIS — N898 Other specified noninflammatory disorders of vagina: Secondary | ICD-10-CM | POA: Diagnosis present

## 2022-05-08 DIAGNOSIS — Z91040 Latex allergy status: Secondary | ICD-10-CM | POA: Diagnosis not present

## 2022-05-08 DIAGNOSIS — B3731 Acute candidiasis of vulva and vagina: Secondary | ICD-10-CM

## 2022-05-08 LAB — URINALYSIS, ROUTINE W REFLEX MICROSCOPIC
Bilirubin Urine: NEGATIVE
Glucose, UA: NEGATIVE mg/dL
Hgb urine dipstick: NEGATIVE
Ketones, ur: NEGATIVE mg/dL
Leukocytes,Ua: NEGATIVE
Nitrite: NEGATIVE
Protein, ur: NEGATIVE mg/dL
Specific Gravity, Urine: 1.013 (ref 1.005–1.030)
pH: 6 (ref 5.0–8.0)

## 2022-05-08 LAB — I-STAT BETA HCG BLOOD, ED (MC, WL, AP ONLY): I-stat hCG, quantitative: <5 m[IU]/mL (ref <5)

## 2022-05-08 LAB — WET PREP, GENITAL
Sperm: NONE SEEN
Trich, Wet Prep: NONE SEEN
WBC, Wet Prep HPF POC: <10 (ref <10)
Yeast Wet Prep HPF POC: NONE SEEN

## 2022-05-08 MED ORDER — FLUCONAZOLE 200 MG PO TABS
200.0000 mg | ORAL_TABLET | Freq: Once | ORAL | Status: AC
  Administered 2022-05-08: 200 mg via ORAL
  Filled 2022-05-08: qty 1

## 2022-05-08 NOTE — ED Notes (Signed)
Pt d/c home per MD order. Discharge summary reviewed, pt verbalizes understanding. Ambulatory off unit. No s/s of acute distress noted at discharge.  °

## 2022-05-08 NOTE — ED Triage Notes (Signed)
Patient c/o bilateral lower back pain and reports a history of kidney disease.  Patient also reports a white vaginal discharge since yesterday.

## 2022-05-08 NOTE — Discharge Instructions (Signed)
Use the over the counter yeast medication for 5 days and you got a pill for yeast here as well.  Avoid any new soaps or products.

## 2022-05-08 NOTE — ED Provider Notes (Signed)
[facility]EM[facility]ENCY DEPT Provider Note   CSN: [id number] Arrival date & time: 05/08/22  0865     History  Chief Complaint  Patient presents with   Back Pain   Vaginal Discharge    Samantha Rojas is a 27 y.o. female.  Patient is a 26 year old female with a history of asthma polycystic kidney disease who is presenting today with a complaint of back pain and vaginal discharge.  She reports symptoms started yesterday but seem worse today.  Also when she urinates she feels like it is hot.  Slight itching in the vaginal area as well.  She has had no abdominal pain, fever, nausea or vomiting.  Last time she has had sex was 1 month ago and she reports using protection every time.  Last time she had an STI was in 2016.  She denies any recent antibiotic use but has changed to a new soap.  She has not noticed any lesions or rashes in her vaginal area.  The history is provided by the patient.  Back Pain Vaginal Discharge     Home Medications Prior to Admission medications   Medication Sig Start Date End Date Taking? Authorizing Provider  albuterol (VENTOLIN HFA) 108 (90 Base) MCG/ACT inhaler Inhale 2 puffs into the lungs 2 (two) times daily as needed for wheezing or shortness of breath.    [provider]  amLODipine (NORVASC) 5 MG tablet Take 5 mg by mouth daily.    [provider]  cetirizine (ZYRTEC) 10 MG tablet Take 10 mg by mouth daily.    [provider]  ibuprofen (ADVIL) 800 MG tablet Take 800 mg by mouth 3 (three) times daily as needed for cramping (pain).    [provider]  silver sulfADIAZINE (SILVADENE) 1 % cream Apply 1 application topically daily. Patient not taking: Reported on 04/06/2022 08/29/17   Dione Booze, MD      Allergies    Latex and Toradol [ketorolac tromethamine]    Review of Systems   Review of Systems  Genitourinary:  Positive for vaginal discharge.  Musculoskeletal:  Positive for back pain.    Physical Exam Updated Vital Signs BP 132/85 (BP Location: Right Arm)   Pulse 65   Temp 99.1 F (37.3 C) (Oral)   Resp 16   Ht 5\' 8"  (1.727 m)   Wt 68 kg   LMP 04/22/2022   SpO2 100%   BMI 22.81 kg/m  Physical Exam Vitals and nursing note reviewed.  Constitutional:      General: She is not in acute distress.    Appearance: She is well-developed.  HENT:     Head: Normocephalic and atraumatic.  Eyes:     Pupils: Pupils are equal, round, and reactive to light.  Cardiovascular:     Rate and Rhythm: Normal rate and regular rhythm.     Heart sounds: Normal heart sounds. No murmur heard.   No friction rub.  Pulmonary:     Effort: Pulmonary effort is normal.     Breath sounds: Normal breath sounds. No wheezing or rales.  Abdominal:     General: Bowel sounds are normal. There is no distension.     Palpations: Abdomen is soft.     Tenderness: There is no abdominal tenderness. There is right CVA tenderness and left CVA tenderness. There is no guarding or rebound.  Genitourinary:    Vagina: Vaginal discharge present.     Comments: Thick white curd-like vaginal discharge with discomfort on bimanual exam.  No acute vaginal bleeding Musculoskeletal:        General: No tenderness. Normal range of motion.     Comments: No edema  Skin:    General: Skin is warm and dry.     Findings: No rash.  Neurological:     Mental Status: She is alert and oriented to person, place, and time.     Cranial Nerves: No cranial nerve deficit.  Psychiatric:        Behavior: Behavior normal.    ED Results / Procedures / Treatments   Labs (all labs ordered are listed, but only abnormal results are displayed) Labs Reviewed  WET PREP, GENITAL - Abnormal; Notable for the following components:      Result Value   Clue Cells Wet Prep HPF POC PRESENT (*)    All other components within normal limits  URINALYSIS, ROUTINE W REFLEX MICROSCOPIC  I-STAT BETA HCG BLOOD, ED ([facility], [facility], [facility] ONLY)  GC/CHLAMYDIA  PROBE AMP ([place]) NOT AT [facility]    EKG None  Radiology No results found.  Procedures Procedures    Medications Ordered in ED Medications  fluconazole (DIFLUCAN) tablet 200 mg (200 mg Oral Given 05/08/22 1141)    ED Course/ Medical Decision Making/ A&P                           Medical Decision Making Amount and/or Complexity of Data Reviewed Labs: ordered. Decision-making details documented in ED Course.  Risk Prescription drug management.   Patient presenting today with symptoms most consistent with candidal vaginitis.  Low suspicion for STI today as she has been sexually abstinent for the last month and prior to that use condoms every time.  She has no rashes or lesions concerning for herpes.  She does have some flank pain and did complain of some mild discomfort when she urinates reporting her urine felt hot.  Suspect it is all from the yeast infection but given her history of polycystic kidney disease and prior kidney stents will ensure no evidence of a UTI.  She is not displaying any evidence suggestive of pyelonephritis at this time.  I independently interpreted patient's labs today and her hCG is negative.  UA neg and wet prep without acute findings.         Final Clinical Impression(s) / ED Diagnoses Final diagnoses:  Yeast vaginitis    Rx / DC Orders ED Discharge Orders     None         Gwyneth Sprout, MD 05/08/22 1148

## 2022-05-11 LAB — GC/CHLAMYDIA PROBE AMP (~~LOC~~) NOT AT ARMC
Chlamydia: NEGATIVE
Comment: NEGATIVE
Comment: NORMAL
Neisseria Gonorrhea: POSITIVE — AB

## 2022-09-15 ENCOUNTER — Ambulatory Visit

## 2022-09-16 ENCOUNTER — Encounter (HOSPITAL_COMMUNITY): Payer: Self-pay

## 2022-09-16 ENCOUNTER — Ambulatory Visit (HOSPITAL_COMMUNITY)
Admission: RE | Admit: 2022-09-16 | Discharge: 2022-09-16 | Disposition: A | Discharge status: Home / Self Care | Source: Ambulatory Visit | Attending: Emergency Medicine | Admitting: Emergency Medicine

## 2022-09-16 VITALS — BP 121/83 | HR 78 | Temp 98.3°F | Resp 16

## 2022-09-16 DIAGNOSIS — J069 Acute upper respiratory infection, unspecified: Secondary | ICD-10-CM

## 2022-09-16 DIAGNOSIS — R509 Fever, unspecified: Secondary | ICD-10-CM | POA: Diagnosis present

## 2022-09-16 DIAGNOSIS — J029 Acute pharyngitis, unspecified: Secondary | ICD-10-CM | POA: Diagnosis not present

## 2022-09-16 LAB — POCT RAPID STREP A, ED / UC: Streptococcus, Group A Screen (Direct): NEGATIVE

## 2022-09-16 NOTE — Discharge Instructions (Addendum)
Your strep test was negative. I recommend using ibuprofen or tylenol as needed for pain. Drink lots of fluids.  Please return if symptoms persist.

## 2022-09-16 NOTE — ED Triage Notes (Signed)
Patient having sore throat and right ear pain. States she was baby sitting and the baby was diagnosed with rhino virus afterwards,.   Onset of symptoms Sunday. Patient had a cough and slight fever. Has been taking OTC Suda fed with relief.

## 2022-09-16 NOTE — ED Provider Notes (Signed)
[place]    CSN: [id number] Arrival date & time: 09/16/22  1021      History   Chief Complaint Chief Complaint  Patient presents with   Sore Throat    Rhino virus from baby - Entered by patient   Hoarse   Otalgia    HPI Samantha Rojas is a 26 y.o. female.  Presents with multiple symptoms that began 3 days ago Sunday subjective fever 104 Sore throat 7/10 Nasal congestion and runny nose Headache, right ear pain  Has tried tylenol, sudafed, mucinex  Was around baby +rhinovirus  Lives in [place], here visiting mom  Past Medical History:  Diagnosis Date   Asthma    PKD (polycystic kidney disease)     There are no problems to display for this patient.   Past Surgical History:  Procedure Laterality Date   kidney stent placement      OB History     Gravida  1   Para      Term      Preterm      AB      Living         SAB      IAB      Ectopic      Multiple      Live Births               Home Medications    Prior to Admission medications   Medication Sig Start Date End Date Taking? Authorizing Provider  albuterol (VENTOLIN HFA) 108 (90 Base) MCG/ACT inhaler Inhale 2 puffs into the lungs 2 (two) times daily as needed for wheezing or shortness of breath.    [provider]  amLODipine (NORVASC) 5 MG tablet Take 5 mg by mouth daily.    [provider]  cetirizine (ZYRTEC) 10 MG tablet Take 10 mg by mouth daily.    [provider]  ibuprofen (ADVIL) 800 MG tablet Take 800 mg by mouth 3 (three) times daily as needed for cramping (pain).    [provider]  silver sulfADIAZINE (SILVADENE) 1 % cream Apply 1 application topically daily. Patient not taking: Reported on 0/06/3709 06/01/93   Delora Fuel, MD    Family History History reviewed. No pertinent family history.  Social History Social History   Tobacco Use   Smoking status: Never   Smokeless tobacco: Never  Vaping Use   Vaping  Use: Former  Substance Use Topics   Alcohol use: Yes    Comment: socially   Drug use: No     Allergies   Latex and Toradol [ketorolac tromethamine]   Review of Systems Review of Systems Per HPI  Physical Exam Triage Vital Signs ED Triage Vitals  Enc Vitals Group     BP 09/16/22 1038 121/83     Pulse Rate 09/16/22 1038 78     Resp 09/16/22 1038 16     Temp 09/16/22 1038 98.3 F (36.8 C)     Temp Source 09/16/22 1038 Oral     SpO2 09/16/22 1038 98 %     Weight --      Height --      Head Circumference --      Peak Flow --      Pain Score 09/16/22 1041 7     Pain Loc --      Pain Edu? --      Excl. in [place]? --    No data found.  Updated Vital Signs  BP 121/83 (BP Location: Left Arm)   Pulse 78   Temp 98.3 F (36.8 C) (Oral)   Resp 16   LMP 08/25/2022 (Exact Date)   SpO2 98%   Breastfeeding No   Physical Exam Vitals and nursing note reviewed.  Constitutional:      General: She is not in acute distress. HENT:     Right Ear: Tympanic membrane and ear canal normal.     Left Ear: Tympanic membrane and ear canal normal.     Nose:     Right Turbinates: Enlarged.     Left Turbinates: Enlarged.     Mouth/Throat:     Mouth: Mucous membranes are moist.     Pharynx: Uvula midline. Posterior oropharyngeal erythema present.     Tonsils: Tonsillar exudate present. No tonsillar abscesses. 2+ on the right. 2+ on the left.  Eyes:     Conjunctiva/sclera: Conjunctivae normal.     Pupils: Pupils are equal, round, and reactive to light.  Cardiovascular:     Rate and Rhythm: Normal rate and regular rhythm.     Heart sounds: Normal heart sounds.  Pulmonary:     Effort: Pulmonary effort is normal.     Breath sounds: Normal breath sounds.  Musculoskeletal:     Cervical back: Normal range of motion.  Lymphadenopathy:     Cervical: No cervical adenopathy.  Neurological:     Mental Status: She is alert and oriented to person, place, and time.      UC Treatments / Results   Labs (all labs ordered are listed, but only abnormal results are displayed) Labs Reviewed  CULTURE, GROUP A STREP [facility])  POCT RAPID STREP A, ED / UC    EKG  Radiology No results found.  Procedures Procedures (including critical care time)  Medications Ordered in UC Medications - No data to display  Initial Impression / Assessment and Plan / UC Course  I have reviewed the triage vital signs and the nursing notes.  Pertinent labs & imaging results that were available during my care of the patient were reviewed by me and considered in my medical decision making (see chart for details).  Strep test negative, culture pending given high fever and throat appearance. Discussed symptomatic care at home. Return precautions discussed. Patient agrees to plan  Final Clinical Impressions(s) / UC Diagnoses   Final diagnoses:  Subjective fever  Viral pharyngitis  Viral upper respiratory tract infection     Discharge Instructions      Your strep test was negative. I recommend using ibuprofen or tylenol as needed for pain. Drink lots of fluids.  Please return if symptoms persist.     ED Prescriptions   None    PDMP not reviewed this encounter.   Andi Mahaffy, Ray Church 09/16/22 1123

## 2022-09-19 LAB — CULTURE, GROUP A STREP (THRC)

## 2022-10-19 ENCOUNTER — Encounter (HOSPITAL_COMMUNITY): Payer: Self-pay

## 2022-10-19 ENCOUNTER — Ambulatory Visit (HOSPITAL_COMMUNITY)
Admission: RE | Admit: 2022-10-19 | Discharge: 2022-10-19 | Disposition: A | Discharge status: Home / Self Care | Source: Ambulatory Visit | Attending: Emergency Medicine | Admitting: Emergency Medicine

## 2022-10-19 VITALS — BP 131/77 | HR 68 | Temp 98.3°F | Resp 17

## 2022-10-19 DIAGNOSIS — K5900 Constipation, unspecified: Secondary | ICD-10-CM | POA: Diagnosis not present

## 2022-10-19 DIAGNOSIS — K589 Irritable bowel syndrome without diarrhea: Secondary | ICD-10-CM

## 2022-10-19 DIAGNOSIS — R350 Frequency of micturition: Secondary | ICD-10-CM

## 2022-10-19 LAB — POCT URINALYSIS DIPSTICK, ED / UC
Bilirubin Urine: NEGATIVE
Glucose, UA: 100 mg/dL — AB
Ketones, ur: NEGATIVE mg/dL
Leukocytes,Ua: NEGATIVE
Nitrite: NEGATIVE
Protein, ur: NEGATIVE mg/dL
Specific Gravity, Urine: >=1.03 (ref 1.005–1.030)
Urobilinogen, UA: 0.2 mg/dL (ref 0.0–1.0)
pH: 5.5 (ref 5.0–8.0)

## 2022-10-19 LAB — POC URINE PREG, ED: Preg Test, Ur: NEGATIVE

## 2022-10-19 MED ORDER — LINACLOTIDE 72 MCG PO CAPS: 72.0000 ug | ORAL_CAPSULE | Freq: Every day | ORAL | 0 refills | Status: DC

## 2022-10-19 NOTE — Discharge Instructions (Addendum)
Please resume taking MiraLax daily, you place one scoop full into juice or water then mix the solution until it dissolved.   You may start taking the Linzess, take one tablet before breakfast.   [place] [facility] if symptoms don't improve, they may need to refer you to a gastroenterologist.

## 2022-10-19 NOTE — ED Provider Notes (Signed)
[place]    CSN: [id number] Arrival date & time: 10/19/22  1646      History   Chief Complaint Chief Complaint  Patient presents with   Constipation   Possible Pregnancy    HPI Samantha Rojas is a 26 y.o. female.  Patient presents complaining of constipation x 2 weeks.  Patient reports straining with bowel movements with a minimal amount of stool each time.  Patient reports nausea.  Patient reports she has worsening symptoms in the morning and " usually when my stomach feels like this I usually have diarrhea but this time around I'm having constipation. Patient reports she has had ongoing issues with the symptoms, she states that another provider mention that she could have irritable bowel syndrome.  Patient is currently in the [employer] and receives primary care from the [place]. patient reports a history of polycystic kidney disease.  Patient reports she drinks half a gallon of water daily.    Constipation Associated symptoms: no abdominal pain, no diarrhea, no dysuria, no fever, no nausea and no vomiting   Possible Pregnancy Pertinent negatives include no abdominal pain.    Past Medical History:  Diagnosis Date   Asthma    PKD (polycystic kidney disease)     There are no problems to display for this patient.   Past Surgical History:  Procedure Laterality Date   kidney stent placement      OB History     Gravida  1   Para      Term      Preterm      AB      Living         SAB      IAB      Ectopic      Multiple      Live Births               Home Medications    Prior to Admission medications   Medication Sig Start Date End Date Taking? Authorizing Provider  linaclotide (LINZESS) 72 MCG capsule Take 1 capsule (72 mcg total) by mouth daily before breakfast. 10/19/22  Yes Flossie Dibble, NP  albuterol (VENTOLIN HFA) 108 (90 Base) MCG/ACT inhaler Inhale 2 puffs into the lungs 2 (two) times daily as needed for wheezing or  shortness of breath.    [provider]  amLODipine (NORVASC) 5 MG tablet Take 5 mg by mouth daily.    [provider]  cetirizine (ZYRTEC) 10 MG tablet Take 10 mg by mouth daily.    [provider]  ibuprofen (ADVIL) 800 MG tablet Take 800 mg by mouth 3 (three) times daily as needed for cramping (pain).    [provider]    Family History History reviewed. No pertinent family history.  Social History Social History   Tobacco Use   Smoking status: Never   Smokeless tobacco: Never  Vaping Use   Vaping Use: Former  Substance Use Topics   Alcohol use: Yes    Comment: socially   Drug use: No     Allergies   Latex and Toradol [ketorolac tromethamine]   Review of Systems Review of Systems  Constitutional:  Negative for activity change, appetite change, chills, fatigue and fever.  Respiratory: Negative.    Cardiovascular: Negative.   Gastrointestinal:  Positive for constipation. Negative for abdominal distention, abdominal pain, blood in stool, diarrhea, nausea, rectal pain and vomiting.  Genitourinary:  Positive for frequency. Negative for difficulty urinating, dysuria,  flank pain, hematuria and urgency.     Physical Exam Triage Vital Signs ED Triage Vitals [10/19/22 1724]  Enc Vitals Group     BP 131/77     Pulse Rate 68     Resp 17     Temp 98.3 F (36.8 C)     Temp Source Oral     SpO2 97 %     Weight      Height      Head Circumference      Peak Flow      Pain Score 4     Pain Loc      Pain Edu?      Excl. in GC?    No data found.  Updated Vital Signs BP 131/77 (BP Location: Right Arm)   Pulse 68   Temp 98.3 F (36.8 C) (Oral)   Resp 17   SpO2 97%     Physical Exam Vitals and nursing note reviewed.  Constitutional:      Appearance: Normal appearance.  Abdominal:     General: Abdomen is flat. Bowel sounds are normal. There is no distension or abdominal bruit. There are no signs of injury.     Palpations:  There is no shifting dullness, fluid wave, hepatomegaly, splenomegaly, mass or pulsatile mass.     Tenderness: There is no abdominal tenderness. There is no right CVA tenderness, left CVA tenderness, guarding or rebound. Negative signs include Murphy's sign and McBurney's sign.  Neurological:     Mental Status: She is alert.      UC Treatments / Results  Labs (all labs ordered are listed, but only abnormal results are displayed) Labs Reviewed  POCT URINALYSIS DIPSTICK, ED / UC - Abnormal; Notable for the following components:      Result Value   Glucose, UA 100 (*)    Hgb urine dipstick SMALL (*)    All other components within normal limits  POC URINE PREG, ED    EKG   Radiology No results found.  Procedures Procedures (including critical care time)  Medications Ordered in UC Medications - No data to display  Initial Impression / Assessment and Plan / UC Course  I have reviewed the triage vital signs and the nursing notes.  Pertinent labs & imaging results that were available during my care of the patient were reviewed by me and considered in my medical decision making (see chart for details).     Patient was evaluated for constipation and symptoms consistent with IBS.  Urine pregnancy was negative.  Urinalysis had 100 glucose and small blood, this does not explain patient's symptomology.  Urinary frequency may be associated with patient's increase in fluid intake recently, she states that she drinks half a gallon of water daily now.  Prescription for Linzess was given, she was made aware of treatment regiment and possible side effects of.  Patient was advised that she can use MiraLAX and was directed on how to use this medication.  Patient was educated to not use both medications at the same time, and to have a hour in between each medication.  Patient was advised that she [place] if her symptoms do not improve  after this treatment regiment.  Patient was made aware of red flag symptoms that would warrant an emergency department visit . Patient verbalized understanding of instructions. Final Clinical Impressions(s) / UC Diagnoses   Final diagnoses:  Constipation, unspecified constipation type  Symptoms consistent with irritable bowel syndrome     Discharge Instructions      Please resume taking MiraLax daily, you place one scoop full into juice or water then mix the solution until it dissolved.   You may start taking the Linzess, take one tablet before breakfast.   Please [place] when you return to [facility] if symptoms don't improve, they may need to refer you to a gastroenterologist.      ED Prescriptions     Medication Sig Dispense Auth. Provider   linaclotide (LINZESS) 72 MCG capsule Take 1 capsule (72 mcg total) by mouth daily before breakfast. 15 capsule Debby Freiberg, NP      PDMP not reviewed this encounter.   Debby Freiberg, NP 10/19/22 2103

## 2022-10-19 NOTE — ED Triage Notes (Signed)
Pt reports intermittent constipation for 2 weeks. States she has had a BM since then but it has been a very small amount and is painful .   Also endorses nausea x 2 weeks and possible pregnancy. LMP 10/19. Has not taken a urine pregnancy test yet.

## 2023-06-07 ENCOUNTER — Ambulatory Visit (HOSPITAL_COMMUNITY)
Admission: RE | Admit: 2023-06-07 | Discharge: 2023-06-07 | Disposition: A | Discharge status: Home / Self Care | Payer: Self-pay | Source: Ambulatory Visit

## 2023-06-07 ENCOUNTER — Encounter (HOSPITAL_COMMUNITY): Payer: Self-pay

## 2023-06-07 VITALS — BP 126/78 | HR 83 | Temp 98.7°F | Resp 16

## 2023-06-07 DIAGNOSIS — Z202 Contact with and (suspected) exposure to infections with a predominantly sexual mode of transmission: Secondary | ICD-10-CM | POA: Insufficient documentation

## 2023-06-07 HISTORY — DX: Essential (primary) hypertension: I10

## 2023-06-07 LAB — POCT URINALYSIS DIP (MANUAL ENTRY)
Bilirubin, UA: NEGATIVE
Glucose, UA: NEGATIVE mg/dL
Ketones, POC UA: NEGATIVE mg/dL
Leukocytes, UA: NEGATIVE
Nitrite, UA: NEGATIVE
Protein Ur, POC: NEGATIVE mg/dL
Spec Grav, UA: 1.02 (ref 1.010–1.025)
Urobilinogen, UA: 0.2 E.U./dL
pH, UA: 6 (ref 5.0–8.0)

## 2023-06-07 LAB — POCT URINE PREGNANCY: Preg Test, Ur: NEGATIVE

## 2023-06-07 MED ORDER — DOXYCYCLINE HYCLATE 100 MG PO CAPS: 100.0000 mg | ORAL_CAPSULE | Freq: Two times a day (BID) | ORAL | 0 refills | Status: AC

## 2023-06-07 NOTE — Discharge Instructions (Addendum)
You were tested today for gonorrhea, chlamydia, trichomonas, BV, and yeast. Given your exposure, we will start treatment for chlamydia. Take the Doxy twice daily for the next 7 days.  Please avoid all forms of intercourse for the next 10 days.  Decrease your exposure to sun shine as this can increase your potential for sunburn.  We will call you with the results of your test once received.  As always, practice safer sexual practices by using protection each and every time, and limiting number of partners.

## 2023-06-07 NOTE — ED Triage Notes (Addendum)
Pt reports she was told by sexual partner she has been exposed to chlamydia. Denies any sxs other than some low abd cramping.

## 2023-06-07 NOTE — ED Provider Notes (Signed)
[facility]    CSN: [id number] Arrival date & time: 06/07/23  1529      History   Chief Complaint Chief Complaint  Patient presents with   Exposure to STD   Appt 1530   Abdominal Pain    HPI Samantha Rojas is a 27 y.o. Rojas.   Samantha 27 year old Rojas presents today due to concern of exposure to chlamydia.  She states this happened about a week ago.  She does report some lower pelvic cramping, but otherwise denies discharge or rash lesions or itching.  She denies dysuria or hematuria.  She has had chlamydia in the past.  Tolerates antibiotics without concern.  No additional symptoms today.   Exposure to STD Associated symptoms include abdominal pain.  Abdominal Pain   Past Medical History:  Diagnosis Date   Asthma    Hypertension    PKD (polycystic kidney disease)     There are no problems to display for this patient.   Past Surgical History:  Procedure Laterality Date   kidney stent placement      OB History     Gravida  1   Para      Term      Preterm      AB      Living         SAB      IAB      Ectopic      Multiple      Live Births               Home Medications    Prior to Admission medications   Medication Sig Start Date End Date Taking? Authorizing Provider  amLODipine (NORVASC) 5 MG tablet Take 5 mg by mouth daily.   Yes [provider]  cetirizine (ZYRTEC) 10 MG tablet Take 10 mg by mouth daily.   Yes [provider]  doxycycline (VIBRAMYCIN) 100 MG capsule Take 1 capsule (100 mg total) by mouth 2 (two) times daily for 7 days. 06/07/23 06/14/23 Yes Kaula Klenke L, PA  HYDROXYZINE HCL PO Take by mouth.   Yes [provider]  albuterol (VENTOLIN HFA) 108 (90 Base) MCG/ACT inhaler Inhale 2 puffs into the lungs 2 (two) times daily as needed for wheezing or shortness of breath.    [provider]  ibuprofen (ADVIL) 800 MG tablet Take 800 mg by mouth 3 (three) times daily as  needed for cramping (pain).    [provider]    Family History History reviewed. No pertinent family history.  Social History Social History   Tobacco Use   Smoking status: Every Day    Types: Cigarettes   Smokeless tobacco: Never  Vaping Use   Vaping Use: Former  Substance Use Topics   Alcohol use: Yes    Comment: socially   Drug use: No     Allergies   Latex and Toradol [ketorolac tromethamine]   Review of Systems Review of Systems  Gastrointestinal:  Positive for abdominal pain.  As per HPI   Physical Exam Triage Vital Signs ED Triage Vitals [06/07/23 1554]  Enc Vitals Group     BP 126/78     Pulse Rate 83     Resp 16     Temp 98.7 F (37.1 C)     Temp Source Oral     SpO2 97 %     Weight      Height      Head Circumference  Peak Flow      Pain Score 0     Pain Loc      Pain Edu?      Excl. in GC?    No data found.  Updated Vital Signs BP 126/78   Pulse 83   Temp 98.7 F (37.1 C) (Oral)   Resp 16   SpO2 97%   Visual Acuity Right Eye Distance:   Left Eye Distance:   Bilateral Distance:    Right Eye Near:   Left Eye Near:    Bilateral Near:     Physical Exam Vitals and nursing note reviewed.  Constitutional:      General: She is not in acute distress.    Appearance: She is well-developed and normal weight. She is not ill-appearing, toxic-appearing or diaphoretic.  HENT:     Head: Normocephalic and atraumatic.     Mouth/Throat:     Mouth: Mucous membranes are moist.     Pharynx: No pharyngeal swelling.  Eyes:     Extraocular Movements: Extraocular movements intact.     Pupils: Pupils are equal, round, and reactive to light.  Cardiovascular:     Rate and Rhythm: Normal rate.  Pulmonary:     Effort: Pulmonary effort is normal. No respiratory distress.  Abdominal:     General: Abdomen is flat. Bowel sounds are normal.     Palpations: Abdomen is soft. There is no hepatomegaly or splenomegaly.     Tenderness: There  is no abdominal tenderness. There is no right CVA tenderness, left CVA tenderness, guarding or rebound.  Skin:    General: Skin is warm and dry.     Coloration: Skin is not cyanotic, mottled or pale.     Findings: No rash.  Neurological:     General: No focal deficit present.     Mental Status: She is alert and oriented to person, place, and time.      UC Treatments / Results  Labs (all labs ordered are listed, but only abnormal results are displayed) Labs Reviewed  POCT URINALYSIS DIP (MANUAL ENTRY) - Abnormal; Notable for the following components:      Result Value   Blood, UA moderate (*)    All other components within normal limits  POCT URINE PREGNANCY    EKG   Radiology No results found.  Procedures Procedures (including critical care time)  Medications Ordered in UC Medications - No data to display  Initial Impression / Assessment and Plan / UC Course  I have reviewed the triage vital signs and the nursing notes.  Pertinent labs & imaging results that were available during my care of the patient were reviewed by me and considered in my medical decision making (see chart for details).     Exposure to chlamydia - given known exposure, will initiate treatment with BID doxycycline x 7 days. Aptima swab collected. Pt instructed to refrain from all forms of intercourse for 10 days. Will call with results of swab if additional positive tests received.   Final Clinical Impressions(s) / UC Diagnoses   Final diagnoses:  Exposure to chlamydia     Discharge Instructions      You were tested today for gonorrhea, chlamydia, trichomonas, BV, and yeast. Given your exposure, we will start treatment for chlamydia. Take the Doxy twice daily for the next 7 days.  Please avoid all forms of intercourse for the next 10 days.  Decrease your exposure to sun shine as this can increase your potential for sunburn.  We will call you with the results of your test once  received.  As always, practice safer sexual practices by using protection each and every time, and limiting number of partners.      ED Prescriptions     Medication Sig Dispense Auth. Provider   doxycycline (VIBRAMYCIN) 100 MG capsule Take 1 capsule (100 mg total) by mouth 2 (two) times daily for 7 days. 14 capsule Tabathia Knoche L, PA      PDMP not reviewed this encounter.   Maretta Bees, [place] 06/07/23 1651

## 2023-06-09 LAB — CERVICOVAGINAL ANCILLARY ONLY
Bacterial Vaginitis (gardnerella): POSITIVE — AB
Candida Glabrata: NEGATIVE
Candida Vaginitis: NEGATIVE
Chlamydia: NEGATIVE
Comment: NEGATIVE
Comment: NEGATIVE
Comment: NEGATIVE
Comment: NEGATIVE
Comment: NEGATIVE
Comment: NORMAL
Neisseria Gonorrhea: NEGATIVE
Trichomonas: NEGATIVE

## 2023-06-10 ENCOUNTER — Telehealth (HOSPITAL_COMMUNITY): Payer: Self-pay | Admitting: Emergency Medicine

## 2023-06-10 MED ORDER — METRONIDAZOLE 500 MG PO TABS: 500.0000 mg | ORAL_TABLET | Freq: Two times a day (BID) | ORAL | 0 refills | Status: DC

## 2023-06-16 ENCOUNTER — Telehealth (HOSPITAL_COMMUNITY): Payer: Self-pay | Admitting: Emergency Medicine

## 2023-06-16 NOTE — Telephone Encounter (Signed)
Opened in error, patient wanted to confirm pharmcy

## 2023-08-13 ENCOUNTER — Ambulatory Visit (HOSPITAL_COMMUNITY): Payer: Self-pay

## 2024-05-22 ENCOUNTER — Encounter (HOSPITAL_COMMUNITY): Payer: Self-pay | Admitting: Emergency Medicine

## 2024-05-22 ENCOUNTER — Ambulatory Visit (HOSPITAL_COMMUNITY)
Admission: EM | Admit: 2024-05-22 | Discharge: 2024-05-22 | Disposition: A | Discharge status: Home / Self Care | Attending: Internal Medicine | Admitting: Internal Medicine

## 2024-05-22 DIAGNOSIS — N939 Abnormal uterine and vaginal bleeding, unspecified: Secondary | ICD-10-CM

## 2024-05-22 DIAGNOSIS — Z3202 Encounter for pregnancy test, result negative: Secondary | ICD-10-CM

## 2024-05-22 LAB — POCT URINE PREGNANCY: Preg Test, Ur: NEGATIVE

## 2024-05-22 NOTE — ED Provider Notes (Signed)
 [facility]    CSN: [id number] Arrival date & time: 05/22/24  1441      History   Chief Complaint Chief Complaint  Patient presents with   Vaginal Bleeding    HPI Samantha Rojas is a 28 y.o. female.   Patient presents to urgent care for evaluation of vaginal bleeding and bilateral lower abdominal discomfort that started yesterday.  She looked in her MyChart and saw that she had an elevated hCG level.  Upon chart review, it turns out she was actually looking at her hemoglobin A1c from recent visit with the [place] and not an hCG level.  Her last menstrual cycle was Apr 10, 2024.  Denies nausea, vomiting, urinary symptoms, dizziness, headache, and flank pain.  No concern for STDs.  She has not attempted use of any over-the-counter medications to help with symptoms PTA.   Vaginal Bleeding   Past Medical History:  Diagnosis Date   Asthma    Hypertension    PKD (polycystic kidney disease)     There are no active problems to display for this patient.   Past Surgical History:  Procedure Laterality Date   kidney stent placement      OB History     Gravida  1   Para      Term      Preterm      AB      Living         SAB      IAB      Ectopic      Multiple      Live Births               Home Medications    Prior to Admission medications   Medication Sig Start Date End Date Taking? Authorizing Provider  albuterol  (VENTOLIN  HFA) 108 (90 Base) MCG/ACT inhaler Inhale 2 puffs into the lungs 2 (two) times daily as needed for wheezing or shortness of breath.    [provider]  amLODipine (NORVASC) 5 MG tablet Take 5 mg by mouth daily.    [provider]  cetirizine (ZYRTEC) 10 MG tablet Take 10 mg by mouth daily.    [provider]  HYDROXYZINE HCL PO Take by mouth.    [provider]  ibuprofen  (ADVIL ) 800 MG tablet Take 800 mg by mouth 3 (three) times daily as needed for cramping (pain).    [provider]  metroNIDAZOLE  (FLAGYL ) 500 MG tablet Take 1 tablet (500 mg total) by mouth 2 (two) times daily. 06/10/23   Lamptey, Donley Furth, MD    Family History No family history on file.  Social History Social History   Tobacco Use   Smoking status: Every Day    Types: Cigarettes   Smokeless tobacco: Never  Vaping Use   Vaping status: Former  Substance Use Topics   Alcohol use: Yes    Comment: socially   Drug use: No     Allergies   Latex and Toradol [ketorolac tromethamine]   Review of Systems Review of Systems  Genitourinary:  Positive for vaginal bleeding.  Per HPI   Physical Exam Triage Vital Signs ED Triage Vitals  Encounter Vitals Group     BP 05/22/24 1537 127/78     Girls Systolic BP Percentile --      Girls Diastolic BP Percentile --      Boys Systolic BP Percentile --      Boys Diastolic BP Percentile --  Pulse Rate 05/22/24 1537 78     Resp 05/22/24 1537 16     Temp 05/22/24 1537 98.4 F (36.9 C)     Temp Source 05/22/24 1537 Oral     SpO2 05/22/24 1537 99 %     Weight --      Height --      Head Circumference --      Peak Flow --      Pain Score 05/22/24 1536 4     Pain Loc --      Pain Education --      Exclude from Growth Chart --    No data found.  Updated Vital Signs BP 127/78 (BP Location: Right Arm)   Pulse 78   Temp 98.4 F (36.9 C) (Oral)   Resp 16   LMP 05/11/2024   SpO2 99%   Visual Acuity Right Eye Distance:   Left Eye Distance:   Bilateral Distance:    Right Eye Near:   Left Eye Near:    Bilateral Near:     Physical Exam Vitals and nursing note reviewed.  Constitutional:      Appearance: She is not ill-appearing or toxic-appearing.  HENT:     Head: Normocephalic and atraumatic.     Right Ear: Hearing and external ear normal.     Left Ear: Hearing and external ear normal.     Nose: Nose normal.     Mouth/Throat:     Lips: Pink.   Eyes:     General: Lids are normal. Vision grossly intact. Gaze aligned  appropriately.     Extraocular Movements: Extraocular movements intact.     Conjunctiva/sclera: Conjunctivae normal.   Pulmonary:     Effort: Pulmonary effort is normal.  Abdominal:     General: Bowel sounds are normal.     Palpations: Abdomen is soft.     Tenderness: There is no abdominal tenderness. There is no right CVA tenderness, left CVA tenderness or guarding.   Musculoskeletal:     Cervical back: Neck supple.   Skin:    General: Skin is warm and dry.     Capillary Refill: Capillary refill takes less than 2 seconds.     Findings: No rash.   Neurological:     General: No focal deficit present.     Mental Status: She is alert and oriented to person, place, and time. Mental status is at baseline.     Cranial Nerves: No dysarthria or facial asymmetry.   Psychiatric:        Mood and Affect: Mood normal.        Speech: Speech normal.        Behavior: Behavior normal.        Thought Content: Thought content normal.        Judgment: Judgment normal.      UC Treatments / Results  Labs (all labs ordered are listed, but only abnormal results are displayed) Labs Reviewed  POCT URINE PREGNANCY    EKG   Radiology No results found.  Procedures Procedures (including critical care time)  Medications Ordered in UC Medications - No data to display  Initial Impression / Assessment and Plan / UC Course  I have reviewed the triage vital signs and the nursing notes.  Pertinent labs & imaging results that were available during my care of the patient were reviewed by me and considered in my medical decision making (see chart for details).   1.  Vaginal bleeding, urine pregnancy test  negative Vaginal bleeding is likely due to onset of patient's regular menstrual cycle.  Urine pregnancy is negative.  No concern for anemia as her menstrual cycles are regular and she just started bleeding yesterday, therefore deferred CBC.  May use Tylenol /ibuprofen  as needed for abdominal  cramping as well as heating pad.  Follow-up with PCP/OB/GYN as needed.  Counseled patient on potential for adverse effects with medications prescribed/recommended today, strict ER and return-to-clinic precautions discussed, patient verbalized understanding.    Final Clinical Impressions(s) / UC Diagnoses   Final diagnoses:  Vaginal bleeding  Urine pregnancy test negative   Discharge Instructions   None    ED Prescriptions   None    PDMP not reviewed this encounter.   Starlene Eaton, [place] 05/22/24 1708

## 2024-05-22 NOTE — ED Triage Notes (Signed)
 Pt reports had positive HCG the end of May (trying to find her results in her MyChart). Report having vaginal bleeding and abd cramping yesterday.  LMP 5/5

## 2024-07-10 ENCOUNTER — Ambulatory Visit (HOSPITAL_COMMUNITY)

## 2024-11-04 ENCOUNTER — Inpatient Hospital Stay (HOSPITAL_COMMUNITY): Payer: MEDICAID

## 2024-11-04 ENCOUNTER — Encounter (HOSPITAL_COMMUNITY): Payer: Self-pay | Admitting: Obstetrics and Gynecology

## 2024-11-04 ENCOUNTER — Other Ambulatory Visit: Payer: Self-pay

## 2024-11-04 ENCOUNTER — Inpatient Hospital Stay (HOSPITAL_COMMUNITY)
Admission: AD | Admit: 2024-11-04 | Discharge: 2024-11-04 | Disposition: A | Discharge status: Home / Self Care | Payer: MEDICAID | Attending: Obstetrics and Gynecology | Admitting: Obstetrics and Gynecology

## 2024-11-04 DIAGNOSIS — O30001 Twin pregnancy, unspecified number of placenta and unspecified number of amniotic sacs, first trimester: Secondary | ICD-10-CM

## 2024-11-04 DIAGNOSIS — Z3491 Encounter for supervision of normal pregnancy, unspecified, first trimester: Secondary | ICD-10-CM

## 2024-11-04 DIAGNOSIS — B9689 Other specified bacterial agents as the cause of diseases classified elsewhere: Secondary | ICD-10-CM | POA: Insufficient documentation

## 2024-11-04 DIAGNOSIS — O26892 Other specified pregnancy related conditions, second trimester: Secondary | ICD-10-CM | POA: Diagnosis present

## 2024-11-04 DIAGNOSIS — O23592 Infection of other part of genital tract in pregnancy, second trimester: Secondary | ICD-10-CM | POA: Insufficient documentation

## 2024-11-04 DIAGNOSIS — Z3A14 14 weeks gestation of pregnancy: Secondary | ICD-10-CM | POA: Diagnosis not present

## 2024-11-04 DIAGNOSIS — O26852 Spotting complicating pregnancy, second trimester: Secondary | ICD-10-CM | POA: Insufficient documentation

## 2024-11-04 DIAGNOSIS — O30042 Twin pregnancy, dichorionic/diamniotic, second trimester: Secondary | ICD-10-CM | POA: Diagnosis not present

## 2024-11-04 DIAGNOSIS — R1030 Lower abdominal pain, unspecified: Secondary | ICD-10-CM | POA: Insufficient documentation

## 2024-11-04 DIAGNOSIS — Z91040 Latex allergy status: Secondary | ICD-10-CM | POA: Insufficient documentation

## 2024-11-04 DIAGNOSIS — Z3A08 8 weeks gestation of pregnancy: Secondary | ICD-10-CM

## 2024-11-04 LAB — CBC
HCT: 35.8 % — ABNORMAL LOW (ref 36.0–46.0)
Hemoglobin: 12.6 g/dL (ref 12.0–15.0)
MCH: 30.1 pg (ref 26.0–34.0)
MCHC: 35.2 g/dL (ref 30.0–36.0)
MCV: 85.6 fL (ref 80.0–100.0)
Platelets: 252 K/uL (ref 150–400)
RBC: 4.18 MIL/uL (ref 3.87–5.11)
RDW: 11.9 % (ref 11.5–15.5)
WBC: 4.7 K/uL (ref 4.0–10.5)
nRBC: 0 % (ref 0.0–0.2)

## 2024-11-04 LAB — WET PREP, GENITAL
Sperm: NONE SEEN
Trich, Wet Prep: NONE SEEN
WBC, Wet Prep HPF POC: >=10 — AB (ref <10)
Yeast Wet Prep HPF POC: NONE SEEN

## 2024-11-04 LAB — URINALYSIS, ROUTINE W REFLEX MICROSCOPIC
Bilirubin Urine: NEGATIVE
Glucose, UA: NEGATIVE mg/dL
Hgb urine dipstick: NEGATIVE
Ketones, ur: 40 mg/dL — AB
Leukocytes,Ua: NEGATIVE
Nitrite: NEGATIVE
Protein, ur: NEGATIVE mg/dL
Specific Gravity, Urine: 1.02 (ref 1.005–1.030)
pH: 6 (ref 5.0–8.0)

## 2024-11-04 LAB — HCG, QUANTITATIVE, PREGNANCY: hCG, Beta Chain, Quant, S: 244876 m[IU]/mL — ABNORMAL HIGH (ref <5)

## 2024-11-04 LAB — POCT PREGNANCY, URINE: Preg Test, Ur: POSITIVE — AB

## 2024-11-04 LAB — SYPHILIS: RPR W/REFLEX TO RPR TITER AND TREPONEMAL ANTIBODIES, TRADITIONAL SCREENING AND DIAGNOSIS ALGORITHM: RPR Ser Ql: NONREACTIVE

## 2024-11-04 LAB — HIV ANTIBODY (ROUTINE TESTING W REFLEX): HIV Screen 4th Generation wRfx: NONREACTIVE

## 2024-11-04 MED ORDER — METRONIDAZOLE 500 MG PO TABS: 500.0000 mg | ORAL_TABLET | Freq: Two times a day (BID) | ORAL | 0 refills | Status: DC

## 2024-11-04 NOTE — MAU Note (Signed)
.  Samantha Rojas is a 28 y.o. at Unknown here in MAU reporting: + pregnancy test last week. Patient report she pooped earlier and has had a sore stomach since then LMP: 09/24/2024 Onset of complaint: today  Pain score: 4/10 There were no vitals filed for this visit.   Lab orders placed from triage:   poct preg

## 2024-11-04 NOTE — Discharge Instructions (Signed)
 Prenatal Care Providers           [facility] @ [facility]  [address] [phone]  [facility] for [facility] @ [facility]   [address]  [phone]  [facility] @ [facility]       [address] [phone]            [facility] for [facility] @ [place]     [phone]          [facility] for [facility] @ [facility]   [address] [phone]  [facility] @ [facility]  [address] [phone]     [facility] for [facility] @ [facility] [address] [place])  [address]   [phone]     [facility]  Phone: [phone]  [facility]  Phone: [phone]  [facility] Phone: [phone]  [facility] Phone: [phone]   [facility] Phone: [phone]  [facility]  Phone: [phone]  Safe Medications in Pregnancy   Acne: Benzoyl Peroxide Salicylic Acid  Backache/Headache: Tylenol : 2 regular strength every 4 hours OR              2 Extra strength every 6 hours  Colds/Coughs/Allergies: Benadryl  (alcohol free) 25 mg every 6 hours as needed Breath right strips Claritin Cepacol throat lozenges Chloraseptic throat spray Cold-Eeze- up to three times per day Cough drops, alcohol free Flonase (by prescription only) Guaifenesin Mucinex Robitussin DM (plain only, alcohol free) Saline nasal spray/drops Sudafed (pseudoephedrine) & Actifed ** use only after [redacted] weeks gestation and if you do not have high blood pressure Tylenol  Vicks Vaporub Zinc lozenges Zyrtec   Constipation: Colace Ducolax suppositories Fleet enema Glycerin suppositories Metamucil Milk of magnesia Miralax Senokot Smooth move tea  Diarrhea: Kaopectate Imodium  A-D  *NO pepto Bismol  Hemorrhoids: Anusol Anusol  HC Preparation H Tucks  Indigestion: Tums Maalox Mylanta Zantac  Pepcid  Insomnia: Benadryl  (alcohol free) 25mg  every 6 hours as needed Tylenol  PM Unisom, no Gelcaps  Leg Cramps: Tums MagGel  Nausea/Vomiting:  Bonine Dramamine Emetrol Ginger extract Sea bands Meclizine  Nausea medication to take during pregnancy:  Unisom (doxylamine succinate 25 mg tablets) Take one tablet daily at bedtime. If symptoms are not adequately controlled, the dose can be increased to a maximum recommended dose of two tablets daily (1/2 tablet in the morning, 1/2 tablet mid-afternoon and one at bedtime). Vitamin B6 100mg  tablets. Take one tablet twice a day (up to 200 mg per day).  Skin Rashes: Aveeno products Benadryl  cream or 25mg  every 6 hours as needed Calamine Lotion 1% cortisone cream  Yeast infection: Gyne-lotrimin 7 Monistat 7   **If taking multiple medications, please check labels to avoid duplicating the same active ingredients **take medication as directed on the label ** Do not exceed 4000 mg of tylenol  in 24 hours **Do not take medications that contain aspirin or ibuprofen 

## 2024-11-04 NOTE — MAU Provider Note (Signed)
 History     CSN: [id number]  Arrival date and time: 11/04/24 9086   Event Date/Time   First Provider Initiated Contact with Patient 11/04/24 1024      Chief Complaint  Patient presents with   Abdominal Pain   HPI  Samantha Rojas is a 28 y.o. G2P0 who presents for evaluation of abdominal cramping. Patient reports lower abdominal cramping with spotting for the last 2-3 days. Patient rates the pain as a 4/10 and has not tried anything for the pain. She denies any discharge. Denies any constipation, diarrhea or any urinary complaints. She has not been seen anywhere for this pregnancy.    OB History     Gravida  2   Para      Term      Preterm      AB      Living         SAB      IAB      Ectopic      Multiple      Live Births              Past Medical History:  Diagnosis Date   Asthma    Hypertension    PKD (polycystic kidney disease)     Past Surgical History:  Procedure Laterality Date   kidney stent placement      No family history on file.  Social History   Tobacco Use   Smoking status: Every Day    Types: Cigarettes   Smokeless tobacco: Never  Vaping Use   Vaping status: Former  Substance Use Topics   Alcohol use: Yes    Comment: socially   Drug use: No    Allergies:  Allergies  Allergen Reactions   Latex Swelling    Welts   Toradol [Ketorolac Tromethamine] Swelling    No medications prior to admission.    Review of Systems  Constitutional: Negative.  Negative for fatigue and fever.  HENT: Negative.    Respiratory: Negative.  Negative for shortness of breath.   Cardiovascular: Negative.  Negative for chest pain.  Gastrointestinal:  Positive for abdominal pain. Negative for constipation, diarrhea, nausea and vomiting.  Genitourinary:  Positive for vaginal bleeding. Negative for dysuria.  Neurological: Negative.  Negative for dizziness and headaches.   Physical Exam   Blood pressure 125/71, pulse 88, temperature  98.7 F (37.1 C), resp. rate 12, last menstrual period 05/11/2024, SpO2 97%.  Patient Vitals for the past 24 hrs:  BP Temp Pulse Resp SpO2  11/04/24 0926 125/71 98.7 F (37.1 C) 88 12 97 %    Physical Exam Vitals and nursing note reviewed.  Constitutional:      General: She is not in acute distress.    Appearance: She is well-developed.  HENT:     Head: Normocephalic.  Eyes:     Pupils: Pupils are equal, round, and reactive to light.  Cardiovascular:     Rate and Rhythm: Normal rate and regular rhythm.     Heart sounds: Normal heart sounds.  Pulmonary:     Effort: Pulmonary effort is normal. No respiratory distress.     Breath sounds: Normal breath sounds.  Abdominal:     General: Bowel sounds are normal. There is no distension.     Palpations: Abdomen is soft.     Tenderness: There is no abdominal tenderness.  Skin:    General: Skin is warm and dry.  Neurological:     Mental Status: She is  alert and oriented to person, place, and time.  Psychiatric:        Mood and Affect: Mood normal.        Behavior: Behavior normal.        Thought Content: Thought content normal.        Judgment: Judgment normal.     MAU Course  Procedures  Results for orders placed or performed during the hospital encounter of 11/04/24 (from the past 24 hours)  Pregnancy, urine POC     Status: Abnormal   Collection Time: 11/04/24  9:22 AM  Result Value Ref Range   Preg Test, Ur POSITIVE (A) NEGATIVE  Urinalysis, Routine w reflex microscopic -Urine, Clean Catch     Status: Abnormal   Collection Time: 11/04/24  9:29 AM  Result Value Ref Range   Color, Urine YELLOW YELLOW   APPearance CLOUDY (A) CLEAR   Specific Gravity, Urine 1.020 1.005 - 1.030   pH 6.0 5.0 - 8.0   Glucose, UA NEGATIVE NEGATIVE mg/dL   Hgb urine dipstick NEGATIVE NEGATIVE   Bilirubin Urine NEGATIVE NEGATIVE   Ketones, ur 40 (A) NEGATIVE mg/dL   Protein, ur NEGATIVE NEGATIVE mg/dL   Nitrite NEGATIVE NEGATIVE    Leukocytes,Ua NEGATIVE NEGATIVE  CBC     Status: Abnormal   Collection Time: 11/04/24  9:52 AM  Result Value Ref Range   WBC 4.7 4.0 - 10.5 K/uL   RBC 4.18 3.87 - 5.11 MIL/uL   Hemoglobin 12.6 12.0 - 15.0 g/dL   HCT 64.1 (L) 63.9 - 53.9 %   MCV 85.6 80.0 - 100.0 fL   MCH 30.1 26.0 - 34.0 pg   MCHC 35.2 30.0 - 36.0 g/dL   RDW 88.0 88.4 - 84.4 %   Platelets 252 150 - 400 K/uL   nRBC 0.0 0.0 - 0.2 %  hCG, quantitative, pregnancy     Status: Abnormal   Collection Time: 11/04/24  9:52 AM  Result Value Ref Range   hCG, Beta Chain, Quant, S 244,876 (H) <5 mIU/mL  Wet prep, genital     Status: Abnormal   Collection Time: 11/04/24 10:12 AM   Specimen: Urine, Clean Catch  Result Value Ref Range   Yeast Wet Prep HPF POC NONE SEEN NONE SEEN   Trich, Wet Prep NONE SEEN NONE SEEN   Clue Cells Wet Prep HPF POC PRESENT (A) NONE SEEN   WBC, Wet Prep HPF POC >=10 (A) <10   Sperm NONE SEEN     US  OB LESS THAN 14 WEEKS WITH OB TRANSVAGINAL Result Date: 11/04/2024 CLINICAL DATA:  Abdominal pain.  Beta hCG = 244,876 EXAM: TWIN OBSTETRIC <14WK US  AND TRANSVAGINAL OB US  TECHNIQUE: Both transabdominal and transvaginal ultrasound examinations were performed for complete evaluation of the gestation as well as the maternal uterus, adnexal regions, and pelvic cul-de-sac. Transvaginal technique was performed to assess early pregnancy. COMPARISON:  None Available. FINDINGS: Number of IUPs:  2 Chorionicity/Amnionicity:  Dichorionic-diamniotic TWIN 1 Yolk sac:  Visualized. Embryo:  Visualized. Cardiac Activity: Visualized. Heart Rate: 166 bpm CRL:  16.8 mm   8 w 1 d                  US  EDC: 06/15/2025 TWIN 2 Yolk sac:  Visualized. Embryo:  Visualized. Cardiac Activity: Visualized. Heart Rate: 162 bpm CRL:  16.4 mm   8 w 0 d                  US  EDC: 06/16/2025 Subchorionic hemorrhage:  None visualized. Maternal uterus/adnexae: Normal ovaries.  Trace pelvic free fluid. IMPRESSION: 1. Dichorionic-diamniotic twin pregnancy  with cardiac activity. 2. Twin A measures 8 weeks 1 day gestation by crown-rump length. Twin B measures 8 weeks 0 days gestation by crown-rump length. Electronically Signed   By: Limin  Xu M.D.   On: 11/04/2024 11:51   US  OB Comp AddL Gest Less 14 Wks Result Date: 11/04/2024 CLINICAL DATA:  Abdominal pain.  Beta hCG = 244,876 EXAM: TWIN OBSTETRIC <14WK US  AND TRANSVAGINAL OB US  TECHNIQUE: Both transabdominal and transvaginal ultrasound examinations were performed for complete evaluation of the gestation as well as the maternal uterus, adnexal regions, and pelvic cul-de-sac. Transvaginal technique was performed to assess early pregnancy. COMPARISON:  None Available. FINDINGS: Number of IUPs:  2 Chorionicity/Amnionicity:  Dichorionic-diamniotic TWIN 1 Yolk sac:  Visualized. Embryo:  Visualized. Cardiac Activity: Visualized. Heart Rate: 166 bpm CRL:  16.8 mm   8 w 1 d                  US  EDC: 06/15/2025 TWIN 2 Yolk sac:  Visualized. Embryo:  Visualized. Cardiac Activity: Visualized. Heart Rate: 162 bpm CRL:  16.4 mm   8 w 0 d                  US  EDC: 06/16/2025 Subchorionic hemorrhage:  None visualized. Maternal uterus/adnexae: Normal ovaries.  Trace pelvic free fluid. IMPRESSION: 1. Dichorionic-diamniotic twin pregnancy with cardiac activity. 2. Twin A measures 8 weeks 1 day gestation by crown-rump length. Twin B measures 8 weeks 0 days gestation by crown-rump length. Electronically Signed   By: Limin  Xu M.D.   On: 11/04/2024 11:51    MDM Labs ordered and reviewed.   UA, UPT CBC, HCG ABO/Rh- B Pos Wet prep and gc/chlamydia US  OB Comp Less 14 weeks with Transvaginal  CNM independently reviewed the imaging ordered. Imaging show live twin gestation  Assessment and Plan   1. Normal intrauterine pregnancy on prenatal ultrasound in first trimester   2. [redacted] weeks gestation of pregnancy   3. Bacterial vaginosis   4. Twin gestation in first trimester, unspecified multiple gestation type     -Discharge  home in stable condition -Rx for metronidazole  sent to patient's pharmacy -First trimester precautions discussed -Patient advised to follow-up with OB to establish care -Patient may return to MAU as needed or if her condition were to change or worsen  Aleck CHRISTELLA Fireman, CNM 11/04/2024, 10:25 AM

## 2024-11-06 LAB — GC/CHLAMYDIA PROBE AMP (~~LOC~~) NOT AT ARMC
Chlamydia: NEGATIVE
Comment: NEGATIVE
Comment: NORMAL
Neisseria Gonorrhea: NEGATIVE

## 2024-12-13 ENCOUNTER — Telehealth: Payer: Self-pay

## 2024-12-13 DIAGNOSIS — O10011 Pre-existing essential hypertension complicating pregnancy, first trimester: Secondary | ICD-10-CM

## 2024-12-13 DIAGNOSIS — O30041 Twin pregnancy, dichorionic/diamniotic, first trimester: Secondary | ICD-10-CM | POA: Diagnosis not present

## 2024-12-13 DIAGNOSIS — O0990 Supervision of high risk pregnancy, unspecified, unspecified trimester: Secondary | ICD-10-CM | POA: Insufficient documentation

## 2024-12-13 DIAGNOSIS — O0991 Supervision of high risk pregnancy, unspecified, first trimester: Secondary | ICD-10-CM | POA: Diagnosis not present

## 2024-12-13 DIAGNOSIS — O30049 Twin pregnancy, dichorionic/diamniotic, unspecified trimester: Secondary | ICD-10-CM | POA: Insufficient documentation

## 2024-12-13 DIAGNOSIS — Z3A13 13 weeks gestation of pregnancy: Secondary | ICD-10-CM | POA: Diagnosis not present

## 2024-12-13 DIAGNOSIS — O10919 Unspecified pre-existing hypertension complicating pregnancy, unspecified trimester: Secondary | ICD-10-CM | POA: Insufficient documentation

## 2024-12-13 NOTE — Progress Notes (Signed)
 New OB Intake  I connected with Samantha Rojas  on 12/13/2024 at  2:15 PM EST by MyChart Video Visit and verified that I am speaking with the correct person using two identifiers. Nurse is located at [facility] and pt is located at Home.  I discussed the limitations, risks, security and privacy concerns of performing an evaluation and management service by telephone and the availability of in person appointments. I also discussed with the patient that there may be a patient responsible charge related to this service. The patient expressed understanding and agreed to proceed.  I explained I am completing New OB Intake today. We discussed EDD of 06/15/25 based on US  at 8 weeks. Pt is G6P0050. I reviewed her allergies, medications and Medical/Surgical/OB history.    Patient Active Problem List   Diagnosis Date Noted   Supervision of high risk pregnancy, antepartum 12/13/2024   Dichorionic diamniotic twin pregnancy, antepartum 12/13/2024   Chronic hypertension affecting pregnancy 12/13/2024     Concerns addressed today   Delivery Plans Plans to deliver at [facility]. Discussed the nature of our practice with multiple providers including residents and students as well as female and female providers. Due to the size of the practice, the delivering provider may not be the same as those providing prenatal care.   Patient is not interested in water birth.  MyChart/Babyscripts MyChart access verified. I explained pt will have some visits in office and some virtually. Babyscripts instructions given and order placed. Patient verifies receipt of registration text/e-mail. Account successfully created and app downloaded. If patient is a candidate for Optimized scheduling, add to sticky note.   Blood Pressure Cuff/Weight Scale Blood pressure cuff at home. Explained after first prenatal appt pt will check weekly and document in Babyscripts. Patient does not have weight scale; patient may purchase if they desire  to track weight weekly in Babyscripts.  Anatomy US  Explained first scheduled US  will be around 19 weeks. Anatomy US  to be scheduled by MFM clerical.   Is patient a CenteringPregnancy candidate?  Declined due to Enrolled in [facility].  Is patient a Mom+Baby Combined Care candidate?  Accepted     Is patient a candidate for Babyscripts Optimization? No, due to chronic hypertension.   First visit review I reviewed new OB appt with patient. Explained pt will be seen by Dr. Zina at first visit . Discussed [place] genetic screening with patient. Pharmacologist and [facility]. Routine prenatal labs, genetic testing, pap smear needed at [facility].   Last Pap No results found for: DIAGPAP  Patient states [facility], she thinks it's been at least three years.  Waddell LITTIE Burows, RN 12/13/2024  4:21 PM

## 2024-12-13 NOTE — Patient Instructions (Signed)

## 2024-12-21 ENCOUNTER — Other Ambulatory Visit: Payer: Self-pay

## 2024-12-21 ENCOUNTER — Encounter: Payer: Self-pay | Admitting: Obstetrics and Gynecology

## 2024-12-21 ENCOUNTER — Other Ambulatory Visit (HOSPITAL_COMMUNITY)
Admission: RE | Admit: 2024-12-21 | Discharge: 2024-12-21 | Disposition: A | Source: Ambulatory Visit | Attending: Obstetrics and Gynecology | Admitting: Obstetrics and Gynecology

## 2024-12-21 ENCOUNTER — Ambulatory Visit (INDEPENDENT_AMBULATORY_CARE_PROVIDER_SITE_OTHER): Payer: Self-pay | Admitting: Obstetrics and Gynecology

## 2024-12-21 VITALS — BP 142/75 | HR 102 | Wt 168.6 lb

## 2024-12-21 DIAGNOSIS — O0992 Supervision of high risk pregnancy, unspecified, second trimester: Secondary | ICD-10-CM | POA: Diagnosis not present

## 2024-12-21 DIAGNOSIS — Z124 Encounter for screening for malignant neoplasm of cervix: Secondary | ICD-10-CM | POA: Diagnosis present

## 2024-12-21 DIAGNOSIS — O0990 Supervision of high risk pregnancy, unspecified, unspecified trimester: Secondary | ICD-10-CM

## 2024-12-21 DIAGNOSIS — O10919 Unspecified pre-existing hypertension complicating pregnancy, unspecified trimester: Secondary | ICD-10-CM

## 2024-12-21 DIAGNOSIS — O30042 Twin pregnancy, dichorionic/diamniotic, second trimester: Secondary | ICD-10-CM | POA: Diagnosis not present

## 2024-12-21 DIAGNOSIS — Z3A14 14 weeks gestation of pregnancy: Secondary | ICD-10-CM | POA: Diagnosis not present

## 2024-12-21 DIAGNOSIS — L739 Follicular disorder, unspecified: Secondary | ICD-10-CM | POA: Diagnosis not present

## 2024-12-21 DIAGNOSIS — O10912 Unspecified pre-existing hypertension complicating pregnancy, second trimester: Secondary | ICD-10-CM

## 2024-12-21 DIAGNOSIS — O30049 Twin pregnancy, dichorionic/diamniotic, unspecified trimester: Secondary | ICD-10-CM

## 2024-12-21 MED ORDER — LABETALOL HCL 200 MG PO TABS: 200.0000 mg | ORAL_TABLET | Freq: Two times a day (BID) | ORAL | 3 refills | Status: AC

## 2024-12-21 MED ORDER — CEFADROXIL 500 MG PO CAPS: 500.0000 mg | ORAL_CAPSULE | Freq: Two times a day (BID) | ORAL | 0 refills | Status: AC

## 2024-12-21 NOTE — Progress Notes (Signed)
 Navigation Referral sent today.

## 2024-12-21 NOTE — Progress Notes (Signed)
 "  INITIAL PRENATAL VISIT NOTE  Subjective:  Samantha Rojas is a 29 y.o. G6P0050 at [redacted]w[redacted]d by 8 week ultrasound being seen today for her initial prenatal visit.  She has an obstetric history significant for SAB x 3, ectopic x 1 and TAB x 1. She has a medical history significant for probable chronic hypertension.  Pt has hx of polycystic kidney disease.  Patient reports no complaints.   . Vag. Bleeding: None.   . Denies leaking of fluid. Pt states procardia is giving her headaches and she requests alternative med. Pt also notes 2 week history of boil/folliculitis on mons pubis.  Pt does admit picking at the lesion.   Past Medical History:  Diagnosis Date   Asthma    Hypertension    PKD (polycystic kidney disease)     Past Surgical History:  Procedure Laterality Date   DILATION AND CURETTAGE OF UTERUS  2018   kidney stent placement      OB History  Gravida Para Term Preterm AB Living  6 0 0 0 5 0  SAB IAB Ectopic Multiple Live Births  3 1 1   0    # Outcome Date GA Lbr Len/2nd Weight Sex Type Anes PTL Lv  6 Current           5 Ectopic 2024          4 IAB 2017          3 SAB           2 SAB           1 SAB             Social History   Socioeconomic History   Marital status: Married    Spouse name: Not on file   Number of children: Not on file   Years of education: Not on file   Highest education level: Not on file  Occupational History   Not on file  Tobacco Use   Smoking status: Every Day    Types: Cigarettes   Smokeless tobacco: Never  Vaping Use   Vaping status: Former  Substance and Sexual Activity   Alcohol use: Not Currently    Comment: socially   Drug use: No   Sexual activity: Not Currently  Other Topics Concern   Not on file  Social History Narrative   Not on file   Social Drivers of Health   Tobacco Use: High Risk (05/22/2024)   Patient History    Smoking Tobacco Use: Every Day    Smokeless Tobacco Use: Never    Passive Exposure: Not on  file  Financial Resource Strain: Not on file  Food Insecurity: Not on file (10/27/2022)  Transportation Needs: Not on file  Physical Activity: Not on file  Stress: Not on file  Social Connections: Not on file  Depression (PHQ2-9): Low Risk (12/21/2024)   Depression (PHQ2-9)    PHQ-2 Score: 3  Alcohol Screen: Not on file  Housing: Not on file  Utilities: Not on file  Health Literacy: Not on file    Family History  Problem Relation Age of Onset   Hypertension Mother    Kidney disease Father    Hypertension Father     Current Medications[1]  Allergies[2]  Review of Systems: Negative except for what is mentioned in HPI.  Objective:   Vitals:   12/21/24 1523  BP: (!) 142/75  Pulse: (!) 102  Weight: 168 lb 9.6 oz (76.5 kg)  Fetal Status: Fetal Heart Rate (bpm): 147         Physical Exam: BP (!) 142/75   Pulse (!) 102   Wt 168 lb 9.6 oz (76.5 kg)   LMP 09/14/2024 (Approximate)   BMI 25.64 kg/m  CONSTITUTIONAL: Well-developed, well-nourished female in no acute distress.  NEUROLOGIC: Alert and oriented to person, place, and time. Normal reflexes, muscle tone coordination. No cranial nerve deficit noted. PSYCHIATRIC: Normal mood and affect. Normal behavior. Normal judgment and thought content. SKIN: Skin is warm and dry. No rash noted. Not diaphoretic. No erythema. No pallor.  Erythematous elevated lesion on mons consistent with a boil/folliculitis HENT:  Normocephalic, atraumatic, External right and left ear normal. Oropharynx is clear and moist EYES: Conjunctivae and EOM are normal.  NECK: Normal range of motion, supple, no masses CARDIOVASCULAR: Normal heart rate noted, regular rhythm RESPIRATORY: Effort and breath sounds normal, no problems with respiration noted BREASTS: deferred ABDOMEN: Soft, nontender, nondistended, gravid. GU: normal appearing external female genitalia, nulliparous normal appearing cervix, scant white discharge in vagina, no lesions noted,  pap and vaginal swabs taken Bimanual: 16 weeks sized uterus, no adnexal tenderness or palpable lesions noted MUSCULOSKELETAL: Normal range of motion. EXT:  No edema and no tenderness. 2+ distal pulses.   Assessment and Plan:  Pregnancy: G6P0050 at [redacted]w[redacted]d by ultrasound  1. Supervision of high risk pregnancy, antepartum (Primary) Continue routine prenatal care  - Culture, OB Urine - GC/Chlamydia probe amp ([place])not at [facility] - CBC/D/Plt+RPR+Rh+ABO+RubIgG... - Hemoglobin A1c - Protein / creatinine ratio, urine - TSH - Comprehensive metabolic panel with GFR - PANORAMA PRENATAL TEST - [facility] Basic Panel  2. Cervical cancer screening  - Cytology - PAP( [place])  3. [redacted] weeks gestation of pregnancy   4. Chronic hypertension affecting pregnancy D/c'd procardia, trial of labetalol  200 mg po BID, BP check ( and boil check) in 2 weeks - labetalol  (NORMODYNE ) 200 MG tablet; Take 1 tablet (200 mg total) by mouth 2 (two) times daily.  Dispense: 60 tablet; Refill: 3  5. Dichorionic diamniotic twin pregnancy, antepartum   6. Folliculitis Advised warm compresses TID, rx for abx given Pt may need I and D with local anesthesia is still present in 2-3 weeks. - cefadroxil  (DURICEF) 500 MG capsule; Take 1 capsule (500 mg total) by mouth 2 (two) times daily.  Dispense: 14 capsule; Refill: 0   Preterm labor symptoms and general obstetric precautions including but not limited to vaginal bleeding, contractions, leaking of fluid and fetal movement were reviewed in detail with the patient.  Please refer to After Visit Summary for other counseling recommendations.   Return in about 4 weeks (around 01/18/2025) for [facility], in person.  Jerilynn DELENA Buddle 12/21/2024 4:12 PM      [1]  Current Outpatient Medications:    albuterol  (VENTOLIN  HFA) 108 (90 Base) MCG/ACT inhaler, Inhale 2 puffs into the lungs 2 (two) times daily as needed for wheezing or shortness of breath., Disp: , Rfl:    aspirin  EC 81 MG tablet, Take 81 mg by mouth daily. Swallow whole., Disp: , Rfl:    cefadroxil  (DURICEF) 500 MG capsule, Take 1 capsule (500 mg total) by mouth 2 (two) times daily., Disp: 14 capsule, Rfl: 0   labetalol  (NORMODYNE ) 200 MG tablet, Take 1 tablet (200 mg total) by mouth 2 (two) times daily., Disp: 60 tablet, Rfl: 3   Prenatal Vit-Fe Fumarate-FA (MULTIVITAMIN-PRENATAL) 27-0.8 MG TABS tablet, Take 1 tablet by mouth daily at 12 noon., Disp: ,  Rfl:  [2]  Allergies Allergen Reactions   Banana Hives and Itching   Latex Swelling    Welts   Toradol [Ketorolac Tromethamine] Swelling   "

## 2024-12-22 LAB — CBC/D/PLT+RPR+RH+ABO+RUBIGG...
Antibody Screen: NEGATIVE
Basophils Absolute: 0 x10E3/uL (ref 0.0–0.2)
Basos: 0 %
EOS (ABSOLUTE): 0.1 x10E3/uL (ref 0.0–0.4)
Eos: 1 %
HCV Ab: NONREACTIVE
HIV Screen 4th Generation wRfx: NONREACTIVE
Hematocrit: 30.7 % — ABNORMAL LOW (ref 34.0–46.6)
Hemoglobin: 10.4 g/dL — ABNORMAL LOW (ref 11.1–15.9)
Hepatitis B Surface Ag: NEGATIVE
Immature Grans (Abs): 0 x10E3/uL (ref 0.0–0.1)
Immature Granulocytes: 0 %
Lymphocytes Absolute: 1.2 x10E3/uL (ref 0.7–3.1)
Lymphs: 16 %
MCH: 29.9 pg (ref 26.6–33.0)
MCHC: 33.9 g/dL (ref 31.5–35.7)
MCV: 88 fL (ref 79–97)
Monocytes Absolute: 0.8 x10E3/uL (ref 0.1–0.9)
Monocytes: 11 %
Neutrophils Absolute: 5.5 x10E3/uL (ref 1.4–7.0)
Neutrophils: 71 %
Platelets: 300 x10E3/uL (ref 150–450)
RBC: 3.48 x10E6/uL — ABNORMAL LOW (ref 3.77–5.28)
RDW: 12.3 % (ref 11.7–15.4)
RPR Ser Ql: NONREACTIVE
Rh Factor: POSITIVE
Rubella Antibodies, IGG: 6.37 {index}
WBC: 7.7 x10E3/uL (ref 3.4–10.8)

## 2024-12-22 LAB — TSH: TSH: <0.005 u[IU]/mL — ABNORMAL LOW (ref 0.450–4.500)

## 2024-12-22 LAB — HEMOGLOBIN A1C
Est. average glucose Bld gHb Est-mCnc: 100 mg/dL
Hgb A1c MFr Bld: 5.1 % (ref 4.8–5.6)

## 2024-12-22 LAB — HCV INTERPRETATION

## 2024-12-22 LAB — COMPREHENSIVE METABOLIC PANEL WITH GFR
ALT: 64 IU/L — ABNORMAL HIGH (ref 0–32)
AST: 26 IU/L (ref 0–40)
Albumin: 4.3 g/dL (ref 4.0–5.0)
Alkaline Phosphatase: 39 IU/L — ABNORMAL LOW (ref 41–116)
BUN/Creatinine Ratio: 13 (ref 9–23)
BUN: 7 mg/dL (ref 6–20)
Bilirubin Total: <0.2 mg/dL (ref 0.0–1.2)
CO2: 19 mmol/L — ABNORMAL LOW (ref 20–29)
Calcium: 9.4 mg/dL (ref 8.7–10.2)
Chloride: 101 mmol/L (ref 96–106)
Creatinine, Ser: 0.54 mg/dL — ABNORMAL LOW (ref 0.57–1.00)
Globulin, Total: 2.4 g/dL (ref 1.5–4.5)
Glucose: 93 mg/dL (ref 70–99)
Potassium: 4.2 mmol/L (ref 3.5–5.2)
Sodium: 136 mmol/L (ref 134–144)
Total Protein: 6.7 g/dL (ref 6.0–8.5)
eGFR: 129 mL/min/1.73

## 2024-12-25 LAB — GC/CHLAMYDIA PROBE AMP (~~LOC~~) NOT AT ARMC
Chlamydia: NEGATIVE
Comment: NEGATIVE
Comment: NORMAL
Neisseria Gonorrhea: NEGATIVE

## 2024-12-25 LAB — CYTOLOGY - PAP: Diagnosis: NEGATIVE

## 2024-12-26 ENCOUNTER — Ambulatory Visit: Payer: Self-pay | Admitting: Obstetrics and Gynecology

## 2024-12-26 DIAGNOSIS — R7989 Other specified abnormal findings of blood chemistry: Secondary | ICD-10-CM

## 2025-01-01 LAB — HORIZON CUSTOM: REPORT SUMMARY: NEGATIVE

## 2025-01-02 ENCOUNTER — Telehealth: Payer: Self-pay | Admitting: *Deleted

## 2025-01-02 LAB — PANORAMA PRENATAL TEST FULL PANEL:PANORAMA TEST PLUS 5 ADDITIONAL MICRODELETIONS

## 2025-01-02 NOTE — Telephone Encounter (Signed)
 Two forms of identification used to verify I was speaking to the patient; Called patient to inform her she will need to have labs drawn at her nurse visit on 01/04/25.  Also inquired about patient's boil; she states she has finished her antibiotic and has been doing warm compresses.  She reports there was some drainage from it and now it is smaller.  She had no questions at this time.   Rosina, RN BSN

## 2025-01-04 ENCOUNTER — Other Ambulatory Visit: Payer: Self-pay

## 2025-01-04 ENCOUNTER — Ambulatory Visit (INDEPENDENT_AMBULATORY_CARE_PROVIDER_SITE_OTHER): Payer: Self-pay

## 2025-01-04 ENCOUNTER — Other Ambulatory Visit

## 2025-01-04 DIAGNOSIS — R7989 Other specified abnormal findings of blood chemistry: Secondary | ICD-10-CM

## 2025-01-04 DIAGNOSIS — Z3482 Encounter for supervision of other normal pregnancy, second trimester: Secondary | ICD-10-CM

## 2025-01-04 NOTE — Progress Notes (Signed)
 Patient here today for blood pressure check.  She denies any headache, visual disturbances, chest pain, or peripheral edema.  She reports taking her BID Labetalol  as prescribed.  Today her  blood pressure is 121/65.  TSH labs and Panorama drawn today as well.  She reports that the boil she discussed with Dr. Zina is still present but has improved and has completed antibiotics.  She will follow up with Dr. Zina at her next visit 01/18/25.     Waddell, RN

## 2025-01-05 ENCOUNTER — Ambulatory Visit: Payer: Self-pay | Admitting: Obstetrics and Gynecology

## 2025-01-05 DIAGNOSIS — R7989 Other specified abnormal findings of blood chemistry: Secondary | ICD-10-CM

## 2025-01-05 LAB — T3, FREE: T3, Free: 3.9 pg/mL (ref 2.0–4.4)

## 2025-01-05 LAB — TSH+FREE T4
Free T4: 0.78 ng/dL — ABNORMAL LOW (ref 0.82–1.77)
TSH: <0.005 u[IU]/mL — ABNORMAL LOW (ref 0.450–4.500)

## 2025-01-05 LAB — THYROTROPIN RECEPTOR AUTOABS: Thyrotropin Receptor Ab: <1.1 [IU]/L (ref 0.00–1.75)

## 2025-01-18 ENCOUNTER — Encounter: Payer: Self-pay | Admitting: Obstetrics and Gynecology

## 2025-01-24 ENCOUNTER — Other Ambulatory Visit

## 2025-01-24 ENCOUNTER — Ambulatory Visit
